# Patient Record
Sex: Male | Born: 1940 | Race: White | Hispanic: No | Marital: Married | Smoking: Former smoker
Health system: Southern US, Community
[De-identification: ages and names within clinical notes are randomized; demographics above are authoritative.]

## PROBLEM LIST (undated history)

## (undated) DIAGNOSIS — E538 Deficiency of other specified B group vitamins: Secondary | ICD-10-CM

## (undated) DIAGNOSIS — G20 Parkinson's disease: Secondary | ICD-10-CM

## (undated) DIAGNOSIS — C61 Malignant neoplasm of prostate: Secondary | ICD-10-CM

## (undated) DIAGNOSIS — E781 Pure hyperglyceridemia: Secondary | ICD-10-CM

## (undated) DIAGNOSIS — F411 Generalized anxiety disorder: Secondary | ICD-10-CM

## (undated) DIAGNOSIS — G20A1 Parkinson's disease without dyskinesia, without mention of fluctuations: Secondary | ICD-10-CM

## (undated) DIAGNOSIS — K5090 Crohn's disease, unspecified, without complications: Secondary | ICD-10-CM

## (undated) DIAGNOSIS — E611 Iron deficiency: Secondary | ICD-10-CM

## (undated) DIAGNOSIS — C189 Malignant neoplasm of colon, unspecified: Secondary | ICD-10-CM

## (undated) HISTORY — DX: Deficiency of other specified B group vitamins: E53.8

## (undated) HISTORY — DX: Malignant neoplasm of colon, unspecified: C18.9

## (undated) HISTORY — PX: CATARACT EXTRACTION, BILATERAL: SHX1313

## (undated) HISTORY — DX: Generalized anxiety disorder: F41.1

## (undated) HISTORY — PX: COLON RESECTION: SHX5231

## (undated) HISTORY — DX: Iron deficiency: E61.1

---

## 2018-08-29 DIAGNOSIS — K5010 Crohn's disease of large intestine without complications: Secondary | ICD-10-CM | POA: Insufficient documentation

## 2018-08-29 DIAGNOSIS — F419 Anxiety disorder, unspecified: Secondary | ICD-10-CM | POA: Insufficient documentation

## 2018-10-06 DIAGNOSIS — Z8546 Personal history of malignant neoplasm of prostate: Secondary | ICD-10-CM | POA: Insufficient documentation

## 2018-11-20 DIAGNOSIS — D510 Vitamin B12 deficiency anemia due to intrinsic factor deficiency: Secondary | ICD-10-CM | POA: Insufficient documentation

## 2018-11-20 DIAGNOSIS — F339 Major depressive disorder, recurrent, unspecified: Secondary | ICD-10-CM | POA: Insufficient documentation

## 2018-11-20 DIAGNOSIS — G629 Polyneuropathy, unspecified: Secondary | ICD-10-CM | POA: Insufficient documentation

## 2019-03-08 ENCOUNTER — Other Ambulatory Visit: Payer: Self-pay

## 2019-03-08 ENCOUNTER — Encounter (HOSPITAL_BASED_OUTPATIENT_CLINIC_OR_DEPARTMENT_OTHER): Payer: Self-pay | Admitting: Emergency Medicine

## 2019-03-08 ENCOUNTER — Emergency Department (HOSPITAL_BASED_OUTPATIENT_CLINIC_OR_DEPARTMENT_OTHER)
Admission: EM | Admit: 2019-03-08 | Discharge: 2019-03-08 | Disposition: A | Discharge status: Home / Self Care | Payer: Medicare HMO | Attending: Emergency Medicine | Admitting: Emergency Medicine

## 2019-03-08 ENCOUNTER — Emergency Department (HOSPITAL_BASED_OUTPATIENT_CLINIC_OR_DEPARTMENT_OTHER): Payer: Medicare HMO

## 2019-03-08 DIAGNOSIS — W19XXXA Unspecified fall, initial encounter: Secondary | ICD-10-CM

## 2019-03-08 DIAGNOSIS — W010XXA Fall on same level from slipping, tripping and stumbling without subsequent striking against object, initial encounter: Secondary | ICD-10-CM | POA: Diagnosis not present

## 2019-03-08 DIAGNOSIS — Y999 Unspecified external cause status: Secondary | ICD-10-CM | POA: Insufficient documentation

## 2019-03-08 DIAGNOSIS — Y9301 Activity, walking, marching and hiking: Secondary | ICD-10-CM | POA: Diagnosis not present

## 2019-03-08 DIAGNOSIS — S0181XA Laceration without foreign body of other part of head, initial encounter: Secondary | ICD-10-CM | POA: Insufficient documentation

## 2019-03-08 DIAGNOSIS — R51 Headache: Secondary | ICD-10-CM | POA: Diagnosis not present

## 2019-03-08 DIAGNOSIS — Y92002 Bathroom of unspecified non-institutional (private) residence single-family (private) house as the place of occurrence of the external cause: Secondary | ICD-10-CM | POA: Insufficient documentation

## 2019-03-08 DIAGNOSIS — S0990XA Unspecified injury of head, initial encounter: Secondary | ICD-10-CM

## 2019-03-08 HISTORY — DX: Parkinson's disease: G20

## 2019-03-08 HISTORY — DX: Malignant neoplasm of prostate: C61

## 2019-03-08 HISTORY — DX: Parkinson's disease without dyskinesia, without mention of fluctuations: G20.A1

## 2019-03-08 HISTORY — DX: Pure hyperglyceridemia: E78.1

## 2019-03-08 HISTORY — DX: Crohn's disease, unspecified, without complications: K50.90

## 2019-03-08 MED ORDER — LIDOCAINE HCL (PF) 1 % IJ SOLN
5.0000 mL | Freq: Once | INTRAMUSCULAR | Status: AC
  Administered 2019-03-08: 5 mL

## 2019-03-08 MED ORDER — LORAZEPAM 1 MG PO TABS
0.5000 mg | ORAL_TABLET | Freq: Once | ORAL | Status: AC
  Administered 2019-03-08: 09:00:00 0.5 mg via ORAL
  Filled 2019-03-08: qty 1

## 2019-03-08 NOTE — ED Notes (Signed)
ED Provider at bedside. 

## 2019-03-08 NOTE — ED Provider Notes (Signed)
Emergency Department Provider Note   I have reviewed the triage vital signs and the nursing notes.   HISTORY  Chief Complaint Fall   HPI Jared Schaefer is a 78 y.o. male with past medical history reviewed below presents to the emergency department for evaluation after mechanical fall at home.  Patient states he was walking to the bathroom early this morning and states he was "half asleep" when he tripped over something on the floor.  He fell forward striking his head but denies loss of consciousness.  He noticed a cut over the right eyebrow and some bleeding from the bridge of the nose.  He denies pain in the arms or legs.  No back, abdomen, chest pain.  No presyncope symptoms prior to falling.  Patient does not anticoagulated.    Past Medical History:  Diagnosis Date   Crohn's disease ([place])    High triglycerides    Parkinson's disease ([place])    Prostate cancer ([place])     There are no active problems to display for this patient.  Allergies Patient has no known allergies.  No family history on file.  Social History Social History   Tobacco Use   Smoking status: Never Smoker   Smokeless tobacco: Never Used  Substance Use Topics   Alcohol use: Not on file   Drug use: Not on file    Review of Systems  Constitutional: No fever/chills Eyes: No visual changes. ENT: No sore throat. Cardiovascular: Denies chest pain. Respiratory: Denies shortness of breath. Gastrointestinal: No abdominal pain.  No nausea, no vomiting.  No diarrhea.  No constipation. Genitourinary: Negative for dysuria. Musculoskeletal: Negative for back pain. Skin: Negative for rash. Right forehead laceration and bleeding from bridge of nose.  Neurological: Negative for headaches, focal weakness or numbness.  10-point ROS otherwise negative.  ____________________________________________   PHYSICAL EXAM:  VITAL SIGNS: ED Triage Vitals  Enc Vitals Group     BP 03/08/19 0805 (!) 171/86   Pulse Rate 03/08/19 0805 80     Resp 03/08/19 0805 18     Temp 03/08/19 0805 98.1 F (36.7 C)     Temp Source 03/08/19 0805 Oral     SpO2 03/08/19 0805 100 %     Weight 03/08/19 0806 200 lb (90.7 kg)     Height 03/08/19 0806 6' (1.829 m)   Constitutional: Alert and oriented. Well appearing and in no acute distress. Eyes: Conjunctivae are normal. PERRL.  Head: Atraumatic. Nose: No congestion/rhinnorhea. Mouth/Throat: Mucous membranes are moist.  Neck: No stridor. No cervical spine tenderness to palpation. Cardiovascular: Normal rate, regular rhythm.  Respiratory: Normal respiratory effort.  Gastrointestinal: No distention.  Musculoskeletal: No lower extremity tenderness nor edema. No gross deformities of extremities. Neurologic:  Normal speech and language. No gross focal neurologic deficits are appreciated.  Skin:  Skin is warm and dry. 4 cm laceration to the right forehead and superficial abrasion to the nasal bridge without laceration.    ____________________________________________  RADIOLOGY  Ct Head Wo Contrast  Result Date: 03/08/2019 CLINICAL DATA:  Pain following fall EXAM: CT HEAD WITHOUT CONTRAST TECHNIQUE: Contiguous axial images were obtained from the base of the skull through the vertex without intravenous contrast. COMPARISON:  None. FINDINGS: Brain: There is mild diffuse atrophy. There is no intracranial mass, hemorrhage, extra-axial fluid collection, or midline shift. There is mild small vessel disease in the centra semiovale bilaterally. No acute infarct is demonstrable on this study. Vascular: There is no hyperdense vessel. There are foci of calcification  in each carotid siphon region. Skull: Bony calvarium appears intact. There is a right frontal scalp hematoma. Sinuses/Orbits: There is mucosal thickening in several ethmoid air cells. Other visualized paranasal sinuses are clear. There is mild soft tissue swelling slightly superior to the right orbit. Orbits otherwise  appear symmetric bilaterally. Other: Visualized mastoid air cells are clear. IMPRESSION: 1. 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 No acute infarct. No mass or hemorrhage. 2.  There are foci of arterial vascular calcification. 3. Right frontal scalp hematoma and soft tissue swelling superior to the right orbit. No bony abnormality. No intraorbital lesion evident. 4.  Mucosal thickening in several ethmoid air cells. Electronically Signed   By: Lowella Grip III M.D.   On: 03/08/2019 08:30    ____________________________________________   PROCEDURES  Procedure(s) performed:   Marland KitchenMarland KitchenLaceration Repair  Date/Time: 03/08/2019 9:09 AM Performed by: Margette Fast, MD Authorized by: Margette Fast, MD   Consent:    Consent obtained:  Verbal   Consent given by:  Patient   Risks discussed:  Infection, need for additional repair, nerve damage, pain, poor cosmetic result, poor wound healing, retained foreign body and vascular damage   Alternatives discussed:  No treatment Anesthesia (see MAR for exact dosages):    Anesthesia method:  Local infiltration   Local anesthetic:  Lidocaine 2% w/o epi Laceration details:    Location:  Face   Face location:  Forehead   Length (cm):  4 Repair type:    Repair type:  Simple Pre-procedure details:    Preparation:  Patient was prepped and draped in usual sterile fashion and imaging obtained to evaluate for foreign bodies Exploration:    Hemostasis achieved with:  Direct pressure   Wound exploration: wound explored through full range of motion and entire depth of wound probed and visualized     Wound extent: no foreign bodies/material noted, no muscle damage noted, no nerve damage noted, no underlying fracture noted and no vascular damage noted     Contaminated: no   Treatment:    Area cleansed with:  Betadine   Amount of cleaning:  Standard Skin repair:    Repair method:  Sutures   Suture size:  5-0   Suture material:  Prolene    Suture technique:  Simple interrupted   Number of sutures:  6 Approximation:    Approximation:  Close Post-procedure details:    Dressing:  Open (no dressing)   Patient tolerance of procedure:  Tolerated well, no immediate complications    ____________________________________________   INITIAL IMPRESSION / ASSESSMENT AND PLAN / ED COURSE  Pertinent labs & imaging results that were available during my care of the patient were reviewed by me and considered in my medical decision making (see chart for details).   Patient presents to the emergency department for evaluation of mechanical fall at home.  He has an approximately 3 cm right frontal scalp laceration which will require suture.  CT imaging reviewed which shows no acute traumatic process.  Plan for laceration repair.   Laceration repaired as above.  Wound is hemostatic.  Discussed return in 7 days for suture removal either here in the emergency department or with her PCP.  Discussed ED return precautions.  ____________________________________________  FINAL CLINICAL IMPRESSION(S) / ED DIAGNOSES  Final diagnoses:  Fall, initial encounter  Injury of head, initial encounter  Laceration of forehead, initial encounter     MEDICATIONS GIVEN DURING THIS VISIT:  Medications  lidocaine (PF) (XYLOCAINE) 1 % injection 5  mL (5 mLs Infiltration Given 03/08/19 0817)  LORazepam (ATIVAN) tablet 0.5 mg (0.5 mg Oral Given 03/08/19 0849)    Note:  This document was prepared using Dragon voice recognition software and may include unintentional dictation errors.  Nanda Quinton, MD, [facility]    Vantasia Pinkney, [facility], MD 03/08/19 [id number]

## 2019-03-08 NOTE — ED Triage Notes (Signed)
Pt fell while walking from the bathroom.  Pt tripped on something, denies dizziness prior to fall.  Denies loc.  Pt has laceration over right eyebrow, one smaller one on bridge of nose.  Small abrasion to right knee.  Denies any generalized pain currently.

## 2019-03-08 NOTE — Discharge Instructions (Signed)
You were seen in the emergency department today after head injury.  This was repaired with suture.  This will need to be removed in 7 days.  Your primary care doctor can remove these or you can return to the emergency department, preferably early in the morning, to have the sutures removed in 7 days.  If you develop sudden worsening headache, weakness, confusion, vomiting he should return to the emergency department.  If the wound becomes red, inflamed, begins to drain this could be sign of infection and you should also be reevaluated.

## 2019-05-31 ENCOUNTER — Encounter: Payer: Self-pay | Admitting: Neurology

## 2019-06-25 NOTE — Progress Notes (Signed)
Jared Schaefer was seen today in the movement disorders clinic for neurologic consultation at the request of Billie Ruddy I, NP.  The consultation is for the evaluation of PD.  Outside records that were made available to me were reviewed, including those from Dr. Linus Mako, whom the patient just saw as a NP on 03/24/19.  Prior to that, the patient was taken care of at [facility].  Patient was diagnosed with Parkinson's disease in 2011.  First medication from was levodopa.  First symptom was dyscoordination.  no tremor ever.  Records indicate that the patient has done very well since [age], with complications of the disease including dyskinesia.  Patient is on no medication for that.  When he saw Dr. Linus Mako, the plan was to try to decrease the anxiety and also to recognize whether or not this was an off state.  He felt that perhaps attempting low-dose pramipexole would help him with the off state, but he did not give him a prescription for that.    Current movement disorder medications: Carbidopa/levodopa 25/100, 2 tablets 4 times per day (8am/12noon/4pm/8pm) Carbidopa/levodopa 50/200 at bed (10-11pm) Entacapone 1 tablet 3 times per day (first 3 dosages of levodopa - been on for 2-3 years) Mirtazapine, 15 mg q hs prozac 40 mg daily b12 Ativan tid  Specific Symptoms:  Tremor: No. Family hx of similar:  No. Voice: softer Sleep: sleeps well (on meds to help sleep)  Vivid Dreams:  No.  Acting out dreams:  Yes.  , just laughs out Wet Pillows: Yes.  , occasionally Postural symptoms:  Yes.    Falls?  No. Bradykinesia symptoms: shuffling gait but he thinks that ativan helps that Loss of smell:  No. but has never had good sense of smell Loss of taste:  No. Urinary Incontinence:  No. 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 Trouble with ADL's:  No.  Trouble buttoning clothing: No. Depression:  No. but does report anxiety.  Takes Ativan 0.5 mg, up to 3  times per day. Memory changes:  Yes.   with names/word retrieval Hallucinations:  No.  visual distortions: Yes.   N/V:  No. Lightheaded:  No.  Syncope: No. Diplopia:  No. Dyskinesia:  Yes.  , occasionally, but not bothersome to patient   ALLERGIES:  No Known Allergies  CURRENT MEDICATIONS:  Current Outpatient Medications  Medication Instructions  . carbidopa-levodopa (SINEMET CR) 50-200 MG tablet 1 tablet, Oral, Daily at bedtime  . carbidopa-levodopa (SINEMET IR) 25-100 MG tablet 2 tablets, Oral, 4 times daily  . Cholecalciferol (VITAMIN D3 SUPER STRENGTH) 50 MCG (2000 UT) TABS Oral  . cyanocobalamin (,VITAMIN B-12,) 1000 MCG/ML injection Intramuscular  . entacapone (COMTAN) 200 MG tablet 1 tablet, Oral, 3 times daily  . fenofibrate (TRICOR) 145 mg, Oral, Daily  . Ferrous Sulfate (IRON PO) Oral, Slow release  . FLUoxetine (PROZAC) 40 mg, Oral, Daily  . folic acid (FOLVITE) 2 mg, Oral, Daily  . LORazepam (ATIVAN) 0.5 MG tablet 1 tablet, Oral, 2 times daily PRN  . Melatonin 3 MG CAPS 1 capsule, Oral, At bedtime PRN  . mirtazapine (REMERON) 15 MG tablet 1 tablet, Oral, Daily at bedtime  . OPIUM TINCTURE, PAREGORIC, PO 2 drops, Oral, As needed  . sulfaSALAzine (AZULFIDINE) 3,000 mg, Oral, Daily    VITALS:   Vitals:   06/28/19 1005  BP: 120/68  Pulse: 85  Resp: 16  SpO2: 95%  Weight: 191 lb 3.2 oz (86.7 kg)  Height:  6' 1"  (1.854 m)    GEN:  The patient appears stated age and is in NAD. HEENT:  Normocephalic, atraumatic.  The mucous membranes are moist. The superficial temporal arteries are without ropiness or tenderness. CV:  RRR Lungs:  CTAB Neck/HEME:  There are no carotid bruits bilaterally.  Neurological examination:  Orientation: The patient is alert and oriented x3.  Cranial nerves: There is good facial symmetry. Extraocular muscles are intact. The visual fields are full to confrontational testing. The speech is fluent and clear. Soft palate rises symmetrically  and there is no tongue deviation. Hearing is intact to conversational tone. Sensation: Sensation is intact to light and pinprick throughout (facial, trunk, extremities). Vibration is intact at the bilateral big toe. There is no extinction with double simultaneous stimulation. There is no sensory dermatomal level identified. Motor: Strength is 5/5 in the bilateral upper and lower extremities.   Shoulder shrug is equal and symmetric.  There is no pronator drift. Deep tendon reflexes: Deep tendon reflexes are 2/4 at the bilateral biceps, triceps, brachioradialis, patella and achilles. Plantar responses are downgoing bilaterally.  Movement examination: Tone: There is very mild increased tone in the RUE/RLE Abnormal movements: mild dyskinesia in the head/axial region only when significantly distracted Coordination:  There is minimal decremation with RAM's, with any form of RAMS, including alternating supination and pronation of the forearm, hand opening and closing on the right Gait and Station: The patient has no difficulty arising out of a deep-seated chair without the use of the hands. The patient's stride length is good with mild decreased arm swing on the L.      Labs: I have reviewed and interpreted patient's lab work.  He had lab work on May 19, 2019.  White blood cells were slightly low at 4.3, hemoglobin was 11.7 (this was up from 8.3 in November), hematocrit 35.7 and platelets 152.  Patient's iron was normal at 150, but his ferritin is low at 26.5.  On March 10, 2019, his sodium was 137, potassium 4.2, chloride 107, CO2 25, BUN 17, creatinine 1.32, glucose 100, AST 19, ALT 5.   ASSESSMENT/PLAN:  1.  Parkinson's disease, diagnosed 2011, akinetic rigid type,   -movements adequately controlled on meds today  -Patient has been taking care of at [facility].  He sought consultation with Dr. Linus Mako in October, 2020.    -Patient's disease complicated by nonbothersome dyskinesia.  -Patient will  continue on carbidopa/levodopa 25/100, 2 tablets 4 times per day.  -continue carbidopa/levodopa 50/200 q hs  -Patient will continue on entacapone, 200 mg, 1 tablet 3 times per day  -Discussed importance of safe, cardiovascular exercise  -Met my Education officer, museum as part of multidisciplinary program.  -We discussed that it used to be thought that levodopa would increase risk of melanoma but now it is believed that Parkinsons itself likely increases risk of melanoma. he is to get regular skin checks.  2.  b12 deficiency  -gets monthly injections  3.  Anemia  -getting iron supplements  4.  GAD  -think that the pandemic has worsened this and he had a major move from [facility] to [place] during this and hasn't establish his network of people because of covid.  This his sense of fatigue, while it could be from meds, may be from depression and not being able to participate in things that he could.  He plans to go back to gym at [place] soon.  Had him meet my PD LCSW and encouraged him to get involved  with our programs, even if online.   Total time spent on today's visit was  60 minutes, including both face-to-face time and nonface-to-face time.  Time included that spent on review of records (prior notes available to me/labs/imaging if pertinent), discussing treatment and goals, answering patient's questions and coordinating care.  Cc:  [place], [place]

## 2019-06-28 ENCOUNTER — Encounter: Payer: Self-pay | Admitting: Neurology

## 2019-06-28 ENCOUNTER — Other Ambulatory Visit: Payer: Self-pay

## 2019-06-28 ENCOUNTER — Ambulatory Visit (INDEPENDENT_AMBULATORY_CARE_PROVIDER_SITE_OTHER): Payer: Medicare HMO | Admitting: Clinical

## 2019-06-28 ENCOUNTER — Ambulatory Visit: Payer: Medicare HMO | Admitting: Neurology

## 2019-06-28 VITALS — BP 120/68 | HR 85 | Resp 16 | Ht 73.0 in | Wt 191.2 lb

## 2019-06-28 DIAGNOSIS — D509 Iron deficiency anemia, unspecified: Secondary | ICD-10-CM | POA: Diagnosis not present

## 2019-06-28 DIAGNOSIS — G249 Dystonia, unspecified: Secondary | ICD-10-CM

## 2019-06-28 DIAGNOSIS — E538 Deficiency of other specified B group vitamins: Secondary | ICD-10-CM | POA: Diagnosis not present

## 2019-06-28 DIAGNOSIS — G20 Parkinson's disease: Secondary | ICD-10-CM

## 2019-06-28 DIAGNOSIS — F411 Generalized anxiety disorder: Secondary | ICD-10-CM

## 2019-06-28 DIAGNOSIS — Z719 Counseling, unspecified: Secondary | ICD-10-CM

## 2019-06-28 NOTE — Patient Instructions (Signed)
You need to get back to the gym safely!  I would love to see you in a support group.  The physicians and staff at [facility] are committed to providing excellent care. You may receive a survey requesting feedback about your experience at our office. We strive to receive "very good" responses to the survey questions. If you feel that your experience would prevent you from giving the office a "very good " response, please contact our office to try to remedy the situation. We may be reached at [phone]. Thank you for taking the time out of your busy day to complete the survey.

## 2019-06-29 NOTE — BH Specialist Note (Signed)
Referring Provider: Alonza Bogus, DO Date of Referral: 06/28/2019 Primary Reason for Referral: New pt-hx Parkinson's Location of Visit: Individual, office visit  Suicide/Homicide Risk: Pt denies risk Subjective Notes:  Psychosocial Assessment Patient presents today for psychoeducation with LCSW following with new patient appt for Parkinson's Disease by referring provider Dr. Wells Guiles Tat. LCSW provided patient education on nonmotor Parkinson's symptoms such as apathy, depression, incontinence/constipation, sleep behavior disorders, communication and cognitive impairment. LCSW provided supportive counseling as pt reports hx of anxiety and current low-mood likely exacerbated by social isolation of Covid pandemic and recent relocation to [place] in Spring 2020.  LCSW provided pt with information about our support and educational groups for patients with Parkinson's as well as their care partners, also discussed the importance of forced intense exercise in the management of PD and provided information about exercise opportunities.  Also discussed availability of individual counseling sessions to address the adjustment of living with chronic disease of Parkinson's, invited patient to schedule with LCSW as desired. Pt responded receptively to patient education today.   Brief Interventions provided today in session 1. psychoeducation, patient education 2. Supportive counseling   Plan 1. Read "Newly Diagnosed"/ FAQs ([place]), Information provided to pt. Contact LCSW with any questions related to Parkinson's & behavioral health  2. Goal for exercise and connection to Parkinson's online groups  Behavioral Health treatment recommendations communicated to referring provider and pt states agreement with plan. LCSW will remain available for future consultation.

## 2019-07-16 DIAGNOSIS — G629 Polyneuropathy, unspecified: Secondary | ICD-10-CM | POA: Insufficient documentation

## 2019-07-16 DIAGNOSIS — R202 Paresthesia of skin: Secondary | ICD-10-CM | POA: Insufficient documentation

## 2019-08-25 ENCOUNTER — Telehealth: Payer: Self-pay | Admitting: Neurology

## 2019-08-25 NOTE — Telephone Encounter (Signed)
Advised patients spouse that I would forward the message to Dr Tat and give her a call tomorrow. She voiced understanding.

## 2019-08-25 NOTE — Telephone Encounter (Signed)
Patient's wife called regarding Jared Schaefer feeling depressed and anxiety. She is wondering should his other medications be increased or his Parkinson's medication be adjusted? Please Call. Thank you

## 2019-08-26 NOTE — Telephone Encounter (Signed)
Patients wife notified and voiced understanding.

## 2019-08-26 NOTE — Telephone Encounter (Signed)
They should contact their PCP regarding treatment for anxiety and depression.  Its an important aspect of his care but one that should be done in concert with his PCP and/or psychiatrist.

## 2019-11-02 ENCOUNTER — Telehealth: Payer: Self-pay | Admitting: Neurology

## 2019-11-02 NOTE — Telephone Encounter (Signed)
Patient would like to speak to someone about a new medication he thinks it is spelled Nuplax please call

## 2019-11-02 NOTE — Telephone Encounter (Signed)
Spoke with patient and he states he saw an ad on the TV that said 60-70 % of people who have parkinson's disease have hallucinations. He wants to know if he can get a sooner appt to discuss Nuplazid. He was not happy about being seen every 6 months. He states when he was in [place]. his doctor had a PA who he could see if the doctor was unavailable. I explained to him that we do not have that here. Informed patient that I would speak with Dr Tat.

## 2019-11-02 NOTE — Telephone Encounter (Signed)
He knew when he came to our practice that we don't have NP/PA at this time.  After he left [place], he went to [facility] and saw Dr. Linus Mako and an NP and then transferred here.  He could certainly go back if he prefers that care model Re: nuplazid.  First, the percent is not correct, its closer to 40%.  Second, as far as I know he doesn't have hallucinations.  IF he developed them since our last visit, he needs to talk to his new psychiatrist as they have recently changed his meds per records: - DECREASE Fluoxetine from 40 to 23m qd, with ultimate goal of d/c since this hasn't seemed to be helpful, to reduce polypharmacy - INCREASE Mirtazapine from 15 to 321mqhs for sleep, anxiety - RESTART Bupropion XL 1506md for mood, energy - Continue Lorazepam 0.5mg37mD PRN. Encouraged PRN use only. 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

## 2019-11-03 NOTE — Telephone Encounter (Signed)
Patient has been notified directly; all questions, if any, were answered. Patient voiced understanding.   Gave follow up appointment date and time.

## 2019-11-24 NOTE — Progress Notes (Deleted)
Assessment/Plan:   1.  Parkinsons Disease, diagnosed 2011  -***Continue carbidopa/levodopa 25/100, 2 tablets 4 times per day  -Continue carbidopa/levodopa 50/200 nightly  -Continue entacapone 200 mg, 1 tablet 3 times per day  2.  B12 deficiency  -on injections  3.  Iron def anemia  -on supplement  4.  GAD  -some related to pandemic.  Some related to move from [facility] and not having network of people  -Patient now following with psychiatry.  On fairly high-dose mirtazapine, 45 mg at bedtime as well as lorazepam 0.5 mg 4 times per day.  Discussed with patient that I really do not care for this dose of lorazepam in my aging, Parkinson's population.   Subjective:   Jared Schaefer was seen today in follow up for Parkinsons disease.  My previous records were reviewed prior to todays visit as well as outside records available to me. Pt denies falls.  Pt denies lightheadedness, near syncope.  No hallucinations.  Patient has followed with psychiatry.  He last saw psychiatry on May 28.  His mirtazapine and lorazepam were increased.  He was  Current prescribed movement disorder medications: ***Carbidopa/levodopa 25/100, 2 po qid Carbidopa/levodopa 50/200 q hs Entacapone, 200 tid B12 inj Mirtazapine, 45 mg at bedtime (prescribed by psychiatry) Lorazepam 0.5 mg 4 times per day (prescribed by psychiatry)  PREVIOUS MEDICATIONS: {Parkinson's RX:18200}  ALLERGIES:  No Known Allergies  CURRENT MEDICATIONS:  Outpatient Encounter Medications as of 11/29/2019  Medication Sig  . carbidopa-levodopa (SINEMET CR) 50-200 MG tablet Take 1 tablet by mouth at bedtime.  . carbidopa-levodopa (SINEMET IR) 25-100 MG tablet Take 2 tablets by mouth 4 (four) times daily.   . Cholecalciferol (VITAMIN D3 SUPER STRENGTH) 50 MCG (2000 UT) TABS Take by mouth.  . cyanocobalamin (,VITAMIN B-12,) 1000 MCG/ML injection Inject into the muscle.  . entacapone (COMTAN) 200 MG tablet Take 1 tablet by mouth 3 (three) times  daily.  . fenofibrate (TRICOR) 145 MG tablet Take 145 mg by mouth daily.  . Ferrous Sulfate (IRON PO) Take by mouth. Slow release  . FLUoxetine (PROZAC) 40 MG capsule Take 40 mg by mouth daily.  . folic acid (FOLVITE) 1 MG tablet Take 2 mg by mouth daily.  Marland Kitchen LORazepam (ATIVAN) 0.5 MG tablet Take 1 tablet by mouth 2 (two) times daily as needed.  . Melatonin 3 MG CAPS Take 1 capsule by mouth at bedtime as needed.  . mirtazapine (REMERON) 15 MG tablet Take 1 tablet by mouth at bedtime.  . OPIUM TINCTURE, PAREGORIC, PO Take 2 drops by mouth as needed.  . sulfaSALAzine (AZULFIDINE) 500 MG tablet Take 3,000 mg by mouth daily.   No facility-administered encounter medications on file as of 11/29/2019.    Objective:   PHYSICAL EXAMINATION:    VITALS:  There were no vitals filed for this visit.  GEN:  The patient appears stated age and is in NAD. HEENT:  Normocephalic, atraumatic.  The mucous membranes are moist. The superficial temporal arteries are without ropiness or tenderness. CV:  RRR Lungs:  CTAB Neck/HEME:  There are no carotid bruits bilaterally.  Neurological examination:  Orientation: The patient is alert and oriented x3. Cranial nerves: There is good facial symmetry with*** facial hypomimia. The speech is fluent and clear. Soft palate rises symmetrically and there is no tongue deviation. Hearing is intact to conversational tone. Sensation: Sensation is intact to light touch throughout Motor: Strength is at least antigravity x4.  Movement examination: Tone: There is ***tone in the ***  Abnormal movements: *** Coordination:  There is *** decremation with RAM's, *** Gait and Station: The patient has *** difficulty arising out of a deep-seated chair without the use of the hands. The patient's stride length is ***.  The patient has a *** pull test.     I have reviewed and interpreted the following labs independently    Chemistry   No results found for: NA, K, CL, CO2, BUN,  CREATININE, GLU No results found for: CALCIUM, ALKPHOS, AST, ALT, BILITOT     No results found for: WBC, HGB, HCT, MCV, PLT  No results found for: TSH   Total time spent on today's visit was ***30 minutes, including both face-to-face time and nonface-to-face time.  Time included that spent on review of records (prior notes available to me/labs/imaging if pertinent), discussing treatment and goals, answering patient's questions and coordinating care.  Cc:  [place], [place]

## 2019-11-29 ENCOUNTER — Other Ambulatory Visit: Payer: Self-pay

## 2019-11-29 ENCOUNTER — Ambulatory Visit: Payer: Medicare HMO | Admitting: Neurology

## 2019-12-07 DIAGNOSIS — G4730 Sleep apnea, unspecified: Secondary | ICD-10-CM | POA: Insufficient documentation

## 2019-12-15 NOTE — Progress Notes (Signed)
Assessment/Plan:   1.  Parkinsons Disease, diagnosed 2011  -Continue carbidopa/levodopa 25/100, 2 tablets 4 times per day  -Continue carbidopa/levodopa 50/200 nightly  -Continue entacapone 200 mg, 1 tablet 3 times per day.  Could consider holding to see if dry mouth gets better.  We discussed today and decided together to continue  -[facility]  -info given on RSB  2.  B12 deficiency  -on injections  3.  Iron def anemia  -on supplement  4.  GAD  -some related to pandemic.  Some related to move from [facility] and not having network of people  -Patient now following with psychiatry.  On fairly high-dose mirtazapine, 45 mg at bedtime as well as lorazepam 0.5 mg 4 times per day.  Discussed with patient that I really do not care for this dose of lorazepam in my aging, Parkinson's population.  PDMP reviewed.  Picked up 360 tablets of lorazepam on 11/24/19.  Pt states that he has just decreased to bid dosing and encouraged him to limit as both mirtazipine and lorazepam will cause EDS that he c/o.  5.  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  We talked about treatments.  The patient is not a candidate for oral anticholinergic therapy because of increased risk of confusion and falls.  We discussed Botox (type A and B) and 1% atropine drops.  We discusssed that candy like lemon drops can help by stimulating mm of the oropharynx to induce swallowing.  He declines botox.  Will let me know if changes mind  6.  Dry mouth  -try OTC biotene  Subjective:   Jared Schaefer was seen today in follow up for Parkinsons disease.  My previous records were reviewed prior to todays visit as well as outside records available to me. This patient is accompanied in the office by his spouse who supplements the history.Pt denies falls.  States that he exercises with DVD but not as faithful with it as should. Pt denies lightheadedness, near syncope.  No hallucinations.  Patient has followed  with psychiatry.  He last saw psychiatry on May 28.  His mirtazapine and lorazepam were increased.  He was .  He c/o EDS.  When we discussed that this could be from meds he said that he reduced ativan back to bid dosing.  He c/o dry mouth and sialorrhea both.    Current prescribed movement disorder medications: Carbidopa/levodopa 25/100, 2 po qid Carbidopa/levodopa 50/200 q hs Entacapone, 200 tid B12 inj Mirtazapine, 45 mg at bedtime (prescribed by psychiatry) Lorazepam 0.5 mg 4 times per day (prescribed by psychiatry)   ALLERGIES:  No Known Allergies  CURRENT MEDICATIONS:  Outpatient Encounter Medications as of 12/20/2019  Medication Sig  . carbidopa-levodopa (SINEMET CR) 50-200 MG tablet Take 1 tablet by mouth at bedtime.  . carbidopa-levodopa (SINEMET IR) 25-100 MG tablet Take 2 tablets by mouth 4 (four) times daily.   . Cholecalciferol (VITAMIN D3 SUPER STRENGTH) 50 MCG (2000 UT) TABS Take 1 tablet by mouth daily.   . cyanocobalamin (,VITAMIN B-12,) 1000 MCG/ML injection Inject 1,000 mcg into the muscle every 30 (thirty) days.   . entacapone (COMTAN) 200 MG tablet Take 1 tablet by mouth 3 (three) times daily.  . fenofibrate (TRICOR) 145 MG tablet Take 145 mg by mouth daily.  . Ferrous Sulfate (IRON PO) Take 1 tablet by mouth daily. Slow release  . folic acid (FOLVITE) 1 MG tablet Take 2 mg by mouth daily.  Marland Kitchen  LORazepam (ATIVAN) 0.5 MG tablet Take 1 tablet by mouth 2 (two) times daily as needed.  . Melatonin 3 MG CAPS Take 1 capsule by mouth at bedtime as needed.  . mirtazapine (REMERON) 15 MG tablet Take 1 tablet by mouth at bedtime.  . Multiple Vitamin (MULTIVITAMIN WITH MINERALS) TABS tablet Take 1 tablet by mouth daily.  . OPIUM TINCTURE, PAREGORIC, PO Take 2 drops by mouth as needed.  . sulfaSALAzine (AZULFIDINE) 500 MG tablet Take 3,000 mg by mouth daily.  . [DISCONTINUED] FLUoxetine (PROZAC) 40 MG capsule Take 40 mg by mouth daily. (Patient not taking: Reported on 12/20/2019)    No facility-administered encounter medications on file as of 12/20/2019.    Objective:   PHYSICAL EXAMINATION:    VITALS:   Vitals:   12/20/19 0836  BP: 131/80  Pulse: 86  SpO2: 97%  Weight: 195 lb (88.5 kg)  Height: 6' (1.829 m)    GEN:  The patient appears stated age and is in NAD. HEENT:  Normocephalic, atraumatic.  The mucous membranes are moist. The superficial temporal arteries are without ropiness or tenderness. CV:  RRR Lungs:  CTAB Neck/HEME:  There are no carotid bruits bilaterally.  Neurological examination:  Orientation: The patient is alert and oriented x3. Cranial nerves: There is good facial symmetry with facial hypomimia. The speech is fluent and clear and mildly hypophonic. Soft palate rises symmetrically and there is no tongue deviation. Hearing is decreased slightly to conversational tone. Sensation: Sensation is intact to light touch throughout Motor: Strength is at least antigravity x4.   Movement examination: Tone: There is normal tone in the UE/LE Abnormal movements: no Coordination:  There is min decremation with RAM's, with foot taps bilaterally Gait and Station: The patient has min difficulty arising out of a deep-seated chair without the use of the hands. The patient's stride length is decreased.  He is forward flexed at the waist and turns en bloc.  He has decreased arm swing.      Total time spent on today's visit was 30 minutes, including both face-to-face time and nonface-to-face time.  Time included that spent on review of records (prior notes available to me/labs/imaging if pertinent), discussing treatment and goals, answering patient's questions and coordinating care.  Cc:  [place], [place]

## 2019-12-20 ENCOUNTER — Ambulatory Visit (INDEPENDENT_AMBULATORY_CARE_PROVIDER_SITE_OTHER): Payer: Medicare HMO | Admitting: Neurology

## 2019-12-20 ENCOUNTER — Other Ambulatory Visit: Payer: Self-pay

## 2019-12-20 ENCOUNTER — Encounter: Payer: Self-pay | Admitting: Neurology

## 2019-12-20 VITALS — BP 131/80 | HR 86 | Ht 72.0 in | Wt 195.0 lb

## 2019-12-20 DIAGNOSIS — G4710 Hypersomnia, unspecified: Secondary | ICD-10-CM

## 2019-12-20 DIAGNOSIS — G20 Parkinson's disease: Secondary | ICD-10-CM

## 2019-12-20 DIAGNOSIS — F411 Generalized anxiety disorder: Secondary | ICD-10-CM | POA: Diagnosis not present

## 2019-12-20 NOTE — Patient Instructions (Addendum)
1.  Try OTC biotene for dry mouth 2.  Limit ativan as it can cause sleepiness and falls 3.  Let me know if you want to consider botox for drooling 4.  I will send a referral to [facility] for physical and speech therapy    The physicians and staff at [facility] are committed to providing excellent care. You may receive a survey requesting feedback about your experience at our office. We strive to receive "very good" responses to the survey questions. If you feel that your experience would prevent you from giving the office a "very good " response, please contact our office to try to remedy the situation. We may be reached at [phone]. Thank you for taking the time out of your busy day to complete the survey.

## 2019-12-20 NOTE — Addendum Note (Signed)
Addended by: Ulice Brilliant T on: 12/20/2019 10:54 AM   Modules accepted: Orders

## 2020-01-31 IMAGING — CT CT HEAD W/O CM
3 series · 15 of 47 positions shown, 18 images · non-contrast
Comparison: None.

CLINICAL DATA: Pain following fall

EXAM:
CT HEAD WITHOUT CONTRAST
TECHNIQUE: Contiguous axial images were obtained from the base of the skull
through the vertex without intravenous contrast.

[Series 2: head wo · axial · 0.49mm/px · z∈[-95,+45]mm · 9 of 34 slices shown, 12 images]
[im 3/34  brain]
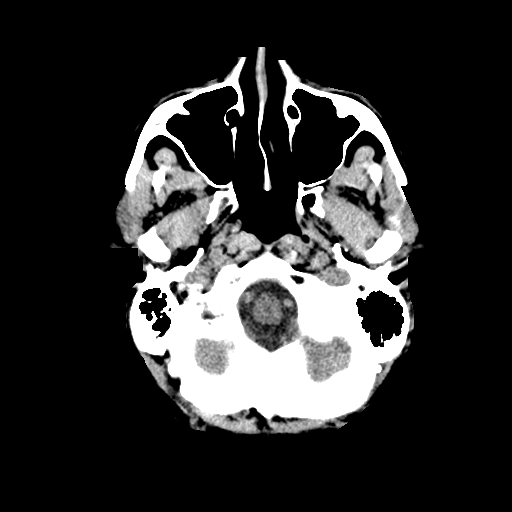
[im 3/34  bone]
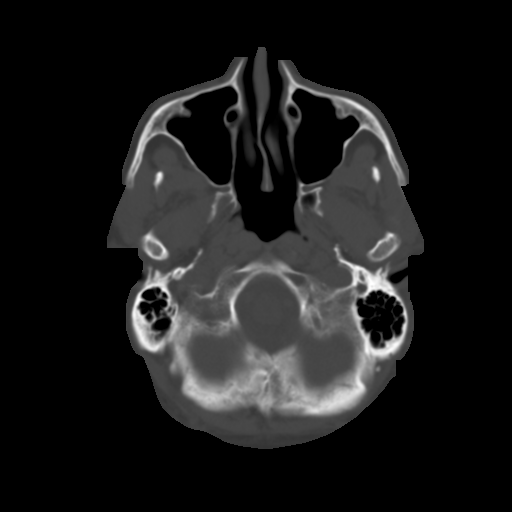
[im 6/34  brain]
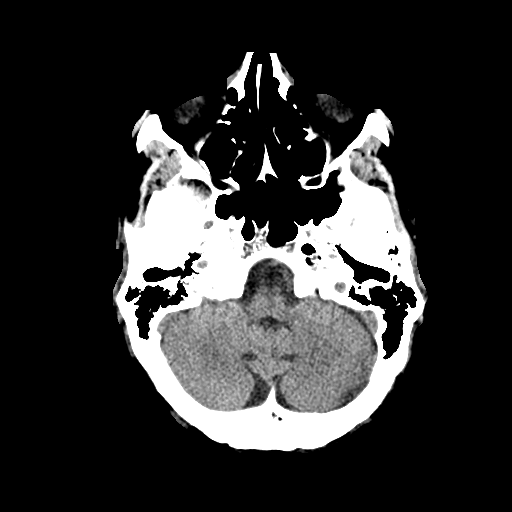
[im 10/34  brain]
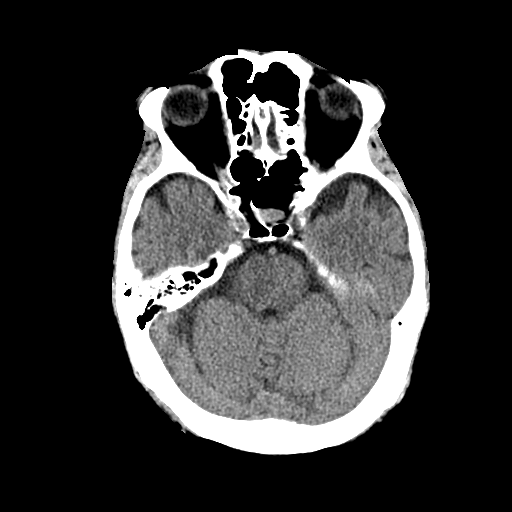
[im 13/34  brain]
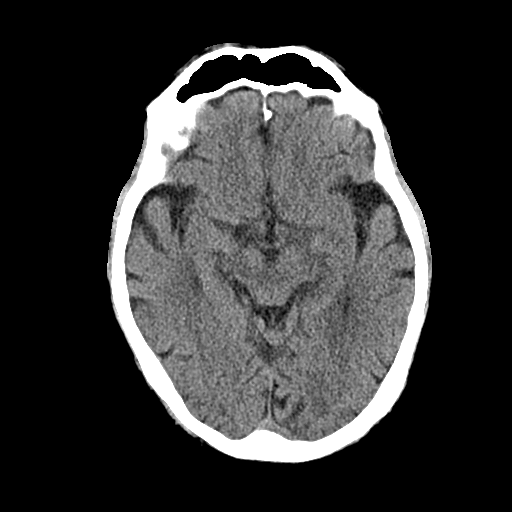
[im 18/34  brain]
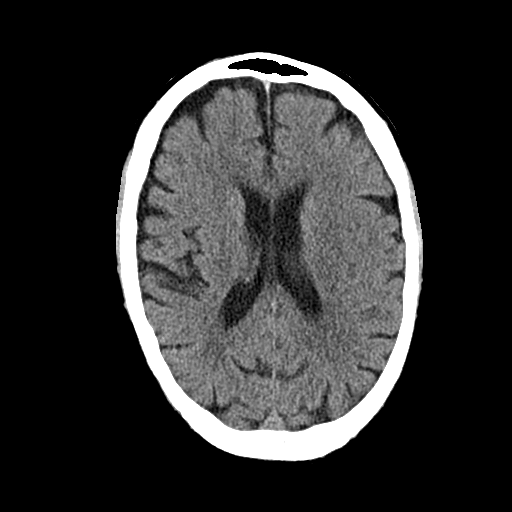
[im 18/34  bone]
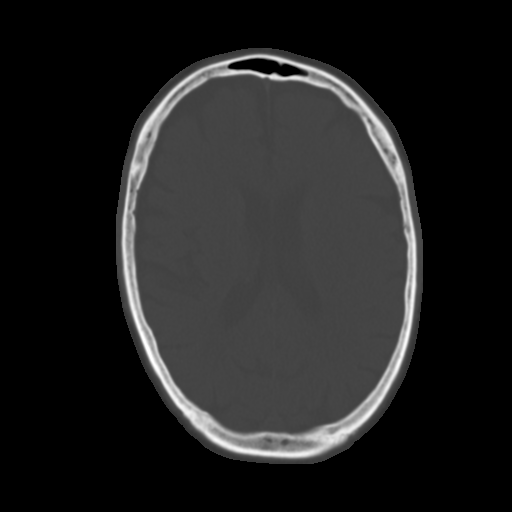
[im 21/34  brain]
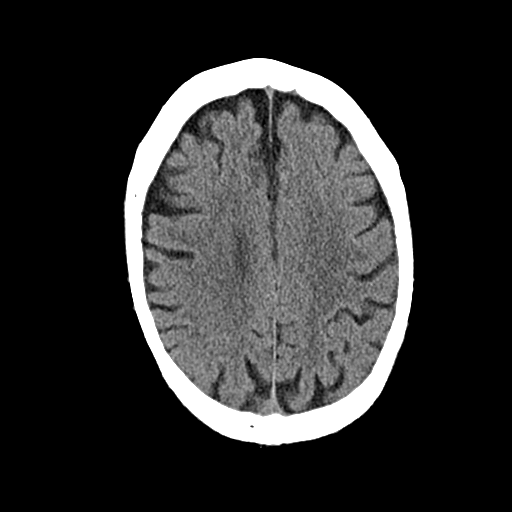
[im 24/34  brain]
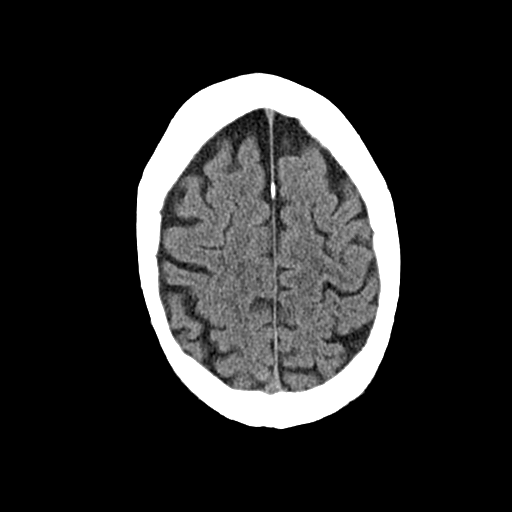
[im 28/34  brain]
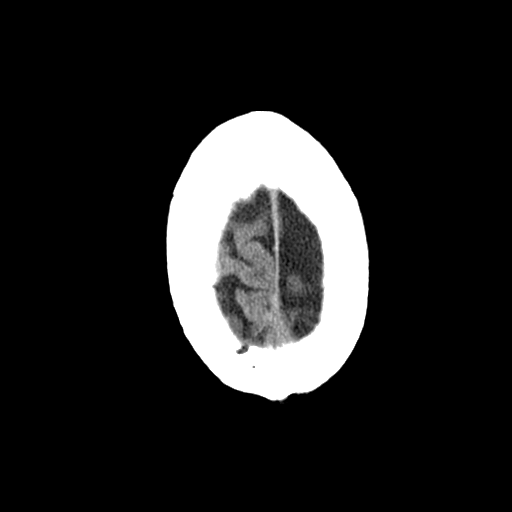
[im 31/34  brain]
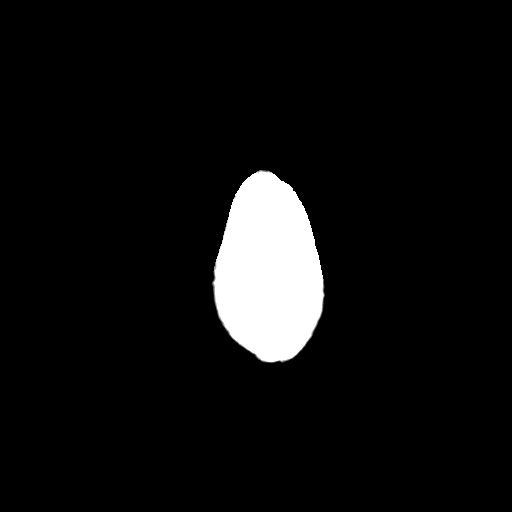
[im 31/34  bone]
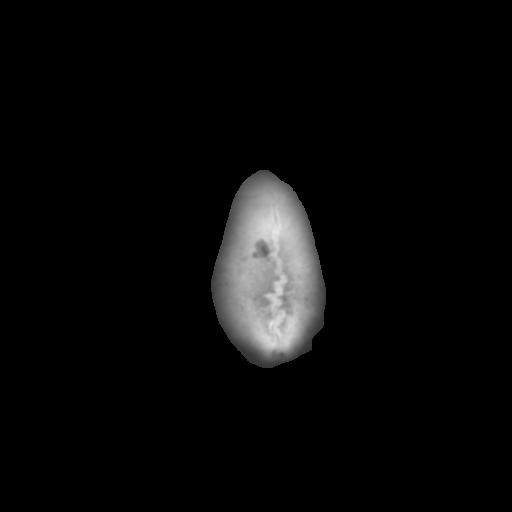

[Series 4: cor soft · coronal · 0.34mm/px · 3 of 76 slices shown]
[im 26/76  brain]
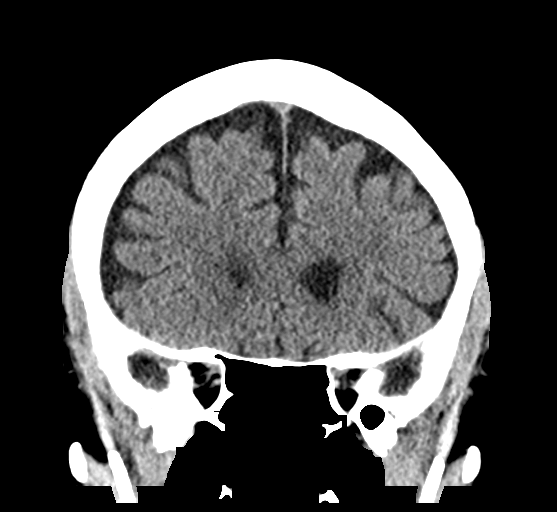
[im 34/76  brain]
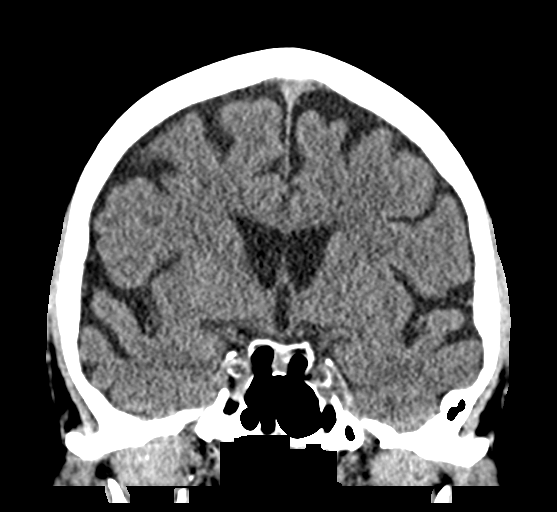
[im 42/76  brain]
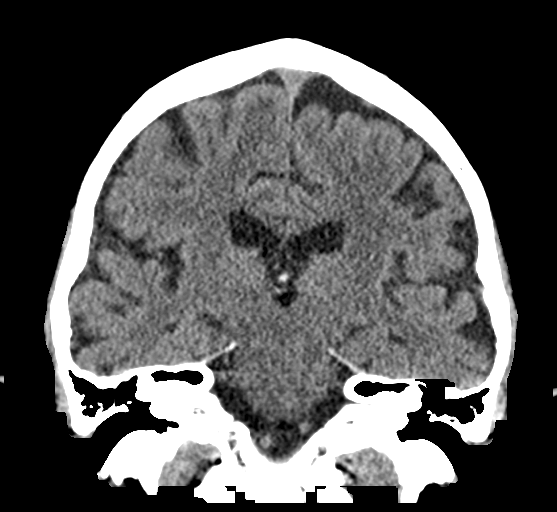

[Series 5: sag soft · sagittal · 0.33mm/px · 3 of 58 slices shown]
[im 20/58  brain]
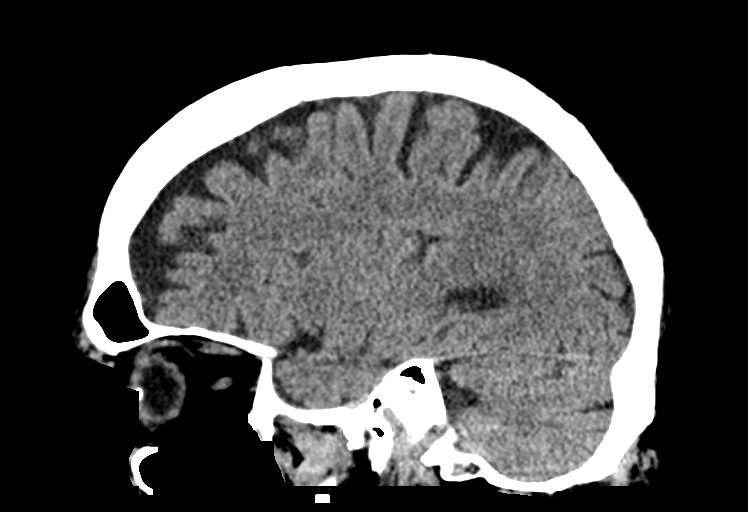
[im 29/58  brain]
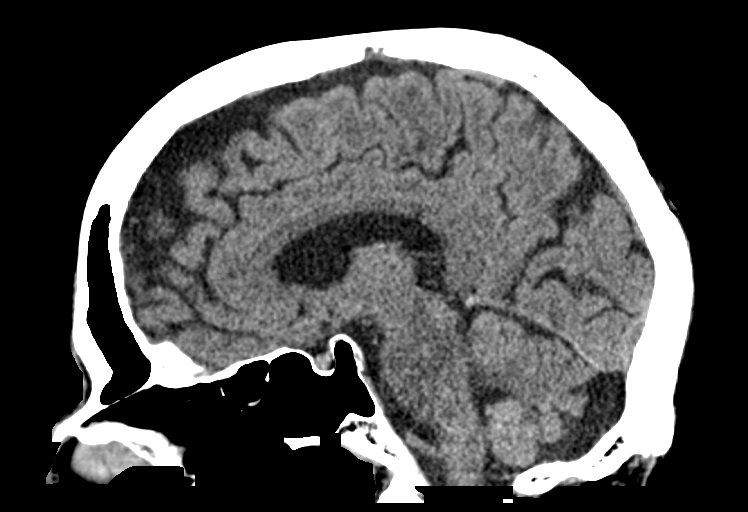
[im 39/58  brain]
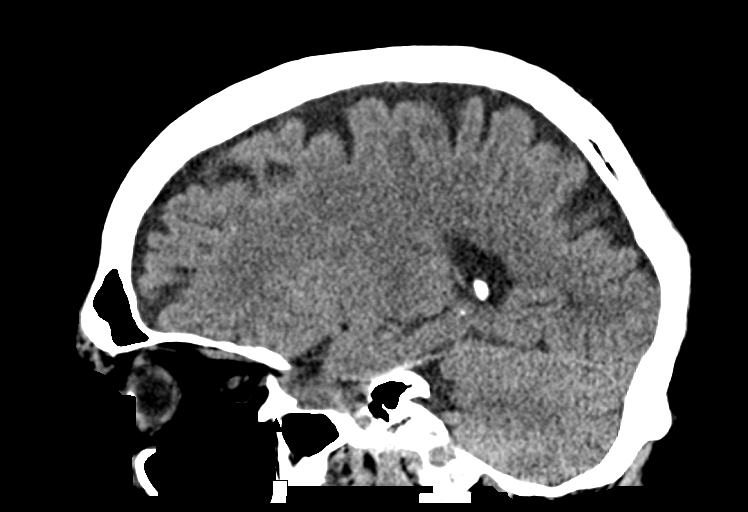

[15 of 47 positions shown; findings below may reference images not displayed]

FINDINGS: Brain: There is mild diffuse atrophy. There is no intracranial mass,
hemorrhage, extra-axial fluid collection, or midline shift. There is
mild small vessel disease in the centra semiovale bilaterally. No
acute infarct is demonstrable on this study.

Vascular: There is no hyperdense vessel. There are foci of
calcification in each carotid siphon region.

Skull: Bony calvarium appears intact. There is a right frontal scalp
hematoma.

Sinuses/Orbits: There is mucosal thickening in several ethmoid air
cells. Other visualized paranasal sinuses are clear. There is mild
soft tissue swelling slightly superior to the right orbit. Orbits
otherwise appear symmetric bilaterally.

Other: Visualized mastoid air cells are clear.
IMPRESSION: 1. Atrophy with mild periventricular small vessel disease. No acute
infarct. No mass or hemorrhage.

2.  There are foci of arterial vascular calcification.

3. Right frontal scalp hematoma and soft tissue swelling superior to
the right orbit. No bony abnormality. No intraorbital lesion
evident.

4.  Mucosal thickening in several ethmoid air cells.

## 2020-02-22 ENCOUNTER — Telehealth: Payer: Self-pay | Admitting: Neurology

## 2020-02-22 NOTE — Telephone Encounter (Signed)
You can get prior auth for myobloc (for drooling) and get him put on the botox schedule

## 2020-02-22 NOTE — Telephone Encounter (Signed)
Patient would like to receive Botox injections. Per his AVS from his last visit, he was instructed to call and let Dr Tat know.

## 2020-02-23 NOTE — Telephone Encounter (Signed)
Tee when you are able to get the approval for Myobloc please let me know and I will get him on the schedule

## 2020-02-25 ENCOUNTER — Telehealth: Payer: Self-pay

## 2020-02-25 NOTE — Telephone Encounter (Signed)
Referral  Type of referral: Physical & Speech Therapy  Provider Office/ Name: [facility]  Phone: [phone]  Fax: [phone]  Address: [address] [place], [place], [place] [address]  Appointment Date and Time: 01/05/2020

## 2020-02-25 NOTE — Telephone Encounter (Signed)
Patient discharged from physical therapy on 01/13/2020 because patient stated therapy was too hard for him. As of right now he will continue speech therapy.

## 2020-03-07 ENCOUNTER — Encounter: Payer: Self-pay | Admitting: Neurology

## 2020-03-07 NOTE — Progress Notes (Addendum)
Ran Benefits Verification through Myobloc ([employer]) website. Can use Buy and bill or [facility] ([facility]). Myobloc covered under Medical Benefits but does require PA. Call [phone] initiate PA.  Per phone call with [place] is actually the preferred. Sent msg to Tat about this.   PA for Xeomin approved valid from 03/07/20 to 03/07/21. Valid for 4 visits. Auth #: [id number].   [facility] not [facility] any [facility]- unable to use SP so only option is to buy and bill for patient.

## 2020-03-17 ENCOUNTER — Ambulatory Visit (INDEPENDENT_AMBULATORY_CARE_PROVIDER_SITE_OTHER): Payer: Medicare HMO | Admitting: Neurology

## 2020-03-17 ENCOUNTER — Other Ambulatory Visit: Payer: Self-pay

## 2020-03-17 DIAGNOSIS — K117 Disturbances of salivary secretion: Secondary | ICD-10-CM | POA: Diagnosis not present

## 2020-03-17 MED ORDER — INCOBOTULINUMTOXINA 100 UNITS IM SOLR
100.0000 [IU] | INTRAMUSCULAR | Status: DC
  Administered 2020-03-17: 100 [IU] via INTRAMUSCULAR

## 2020-03-17 NOTE — Procedures (Signed)
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  The patient understands that this warning states that there have been reported cases of the Botox extending beyond the injection site and creating adverse effects, similar to those of botulism. This included loss of strength, trouble walking, hoarseness, trouble saying words clearly, loss of bladder control, trouble breathing, trouble swallowing, diplopia, blurry vision and ptosis. Most of the distant spread of Botox was happening in patients, primarily children, who received medication for spasticity or for cervical dystonia. The patient expressed understanding and desire to proceed.   Injections  Location Left  Right Units Number of sites  Parotid (point 1 on picture) 30 30 60 1 per side  Submandibular (point 2) 20 20 40 1 per side  TOTAL UNITS:   100    Type of Toxin: Xeomin Discarded Units: 0  Needle drawback with each injection was free of blood. Pt tolerated procedure well without complications.   Reinjection is anticipated in 4 months.

## 2020-04-26 ENCOUNTER — Encounter: Payer: Self-pay | Admitting: Neurology

## 2020-04-26 NOTE — Progress Notes (Addendum)
[facility] Key: [id number] - PA Case ID: [id number] Need help? Call us at [phone] Outcome Approvedtoday Request Reference Number: [id number]. MYOBLOC INJ 5000/ML is approved through 07/27/2020. Your patient may now fill this prescription and it will be covered. Drug Myobloc 5000UNIT/ML solution Form [employer] Medicare Part D Electronic Prior Authorization Form (2017 NCPDP)

## 2020-04-26 NOTE — Progress Notes (Addendum)
Per BV: [facility]  11/17- called to set up account and give verbal script for Myobloc. [facility] said that [facility] is no longer [facility] insurance. We would only have the option to [place].

## 2020-06-19 NOTE — Progress Notes (Signed)
Virtual Visit Via Video   The purpose of this virtual visit is to provide medical care while limiting exposure to the novel coronavirus.    Consent was obtained for video visit:  Yes.   Answered questions that patient had about telehealth interaction:  Yes.   I discussed the limitations, risks, security and privacy concerns of performing an evaluation and management service by telemedicine. I also discussed with the patient that there may be a patient responsible charge related to this service. The patient expressed understanding and agreed to proceed.  Pt location: Home Physician Location: office Name of referring provider:  [place] connected with [name] at patients initiation/request on 06/22/2020 at  3:30 PM EST by video enabled telemedicine application and verified that I am speaking with the correct person using two identifiers. Pt MRN:  [id number] Pt DOB:  04/28/1941 Video Participants:  [name];  Wife supplements hx  Assessment/Plan:   1.  Parkinsons Disease, diagnosed 2011             -Continue carbidopa/levodopa 25/100, 2 tablets 4 times per day but change dosing times to 9am/noon/3pm/6pm.  Can take extra 1/2 tablet prn             -Continue carbidopa/levodopa 50/200 nightly             -Continue entacapone 200 mg, 1 tablet 3 times per day.  Could consider holding to see if dry mouth gets better.  We discussed today and decided together to continue  -Discussed with patient the importance of starting an exercise program.  2.  B12 deficiency             -on injections, with last injection on December 3 per records  3.  Iron def anemia             -on supplement  4.  GAD             -some related to pandemic.  Some related to move from [facility] and not having network of people             -Patient now following with psychiatry.  On fairly high-dose mirtazapine, 45 mg at bedtime as well as lorazepam 0.5 mg, but has reduced this from 4 times per day to  twice per day.  Discussed with patient that I really do not care for this dose of lorazepam in my aging, Parkinson's population.  PDMP reviewed.   5.  Sialorrhea             -Failed Xeomin  -Has upcoming trial of Myobloc in February  6.  Polypharmacy  -We will need to watch closely the fact that the patient is getting morphine (sublingual) as well as Ativan.  I am concerned about this, both in this age group as well as patients with Parkinson's disease.  He and I discussed this in detail.  Discussed fall risk.  Discussed risk for confusion and hallucinations. Subjective:   Jared Schaefer was seen today in follow up for Parkinsons disease.  My previous records were reviewed prior to todays visit as well as outside records available to me. Pt denies falls.  Pt denies lightheadedness, near syncope.  No hallucinations.  Patient follows with psychiatry through [facility].  Last seen on November 30.  Not exercising.   He takes an extra 1/2 tablet of levodopa qod for "anxiety" or inner tremor.    Current prescribed movement disorder medications: Carbidopa/levodopa 25/100, 2 po qid (  8am but isn't up for the day until 9-9:30/noon/4pm/8pm) Carbidopa/levodopa 50/200 q hs (10pm) Entacapone, 200 tid B12 inj Mirtazapine, 45 mg at bedtime (prescribed by psychiatry) Lorazepam 0.5 mg 4 times per day (prescribed by psychiatry -received number 360 tablets on January 3 per PDMP but pt states that going through 2 per day and pdmp refills confirm that is correct)    ALLERGIES:  No Known Allergies  CURRENT MEDICATIONS:  Outpatient Encounter Medications as of 06/22/2020  Medication Sig  . carbidopa-levodopa (SINEMET CR) 50-200 MG tablet Take 1 tablet by mouth at bedtime.  . carbidopa-levodopa (SINEMET IR) 25-100 MG tablet Take 2 tablets by mouth 4 (four) times daily.   . Cholecalciferol (VITAMIN D3 SUPER STRENGTH) 50 MCG (2000 UT) TABS Take 1 tablet by mouth daily.   . cyanocobalamin (,VITAMIN B-12,) 1000  MCG/ML injection Inject 1,000 mcg into the muscle every 30 (thirty) days.   . fenofibrate (TRICOR) 145 MG tablet Take 145 mg by mouth daily.  . folic acid (FOLVITE) 1 MG tablet Take 1 mg by mouth 2 (two) times daily.  Marland Kitchen LORazepam (ATIVAN) 0.5 MG tablet Take 1 tablet by mouth 4 (four) times daily as needed.   . mirtazapine (REMERON) 45 MG tablet Take 1 tablet by mouth at bedtime.   . Multiple Vitamin (MULTIVITAMIN WITH MINERALS) TABS tablet Take 1 tablet by mouth daily.  . OPIUM TINCTURE, PAREGORIC, PO Take 2 drops by mouth as needed.  . sulfaSALAzine (AZULFIDINE) 500 MG tablet Take 3,000 mg by mouth daily.  . [DISCONTINUED] entacapone (COMTAN) 200 MG tablet Take 1 tablet by mouth 3 (three) times daily. (Patient not taking: Reported on 06/22/2020)  . [DISCONTINUED] Ferrous Sulfate (IRON PO) Take 1 tablet by mouth daily. Slow release (Patient not taking: Reported on 06/22/2020)  . [DISCONTINUED] Melatonin 3 MG CAPS Take 1 capsule by mouth at bedtime as needed. (Patient not taking: Reported on 06/22/2020)   Facility-Administered Encounter Medications as of 06/22/2020  Medication  . incobotulinumtoxinA (XEOMIN) 100 units injection 100 Units    Objective:   PHYSICAL EXAMINATION:    VITALS:   Vitals:   06/22/20 1405  Weight: 195 lb (88.5 kg)  Height: 6' 1"  (1.854 m)    .GEN:  The patient appears stated age and is in NAD.  Neurological examination:  Orientation: The patient is alert and oriented x3. Cranial nerves: There is good facial symmetry. There is facial hypomimia.  The speech is fluent and clear. Soft palate rises symmetrically and there is no tongue deviation. Hearing is intact to conversational tone. Motor: Strength is at least antigravity x 4.   Shoulder shrug is equal and symmetric.  There is no pronator drift.  Movement examination: Tone: unable Abnormal movements: None seen, but hands not seen well at rest. Coordination:  There is mild decremation with RAM's Gait and  Station: The patient pushes off of the chair to arise.  He is able to walk fairly well in his home.   Follow up Instructions      -I discussed the assessment and treatment plan with the patient. The patient was provided an opportunity to ask questions and all were answered. The patient agreed with the plan and demonstrated an understanding of the instructions.   The patient was advised to call back or seek an in-person evaluation if the symptoms worsen or if the condition fails to improve as anticipated.    Total time spent on today's visit was 40 minutes, including both face-to-face time and nonface-to-face time.  Time  included that spent on review of records (prior notes available to me/labs/imaging if pertinent), discussing treatment and goals, answering patient's questions and coordinating  Cc:  [place], [place]

## 2020-06-22 ENCOUNTER — Other Ambulatory Visit: Payer: Self-pay

## 2020-06-22 ENCOUNTER — Telehealth (INDEPENDENT_AMBULATORY_CARE_PROVIDER_SITE_OTHER): Payer: Medicare HMO | Admitting: Neurology

## 2020-06-22 ENCOUNTER — Encounter: Payer: Self-pay | Admitting: Neurology

## 2020-06-22 VITALS — Ht 73.0 in | Wt 195.0 lb

## 2020-06-22 DIAGNOSIS — G20 Parkinson's disease: Secondary | ICD-10-CM | POA: Diagnosis not present

## 2020-06-22 DIAGNOSIS — K117 Disturbances of salivary secretion: Secondary | ICD-10-CM | POA: Diagnosis not present

## 2020-06-22 DIAGNOSIS — F411 Generalized anxiety disorder: Secondary | ICD-10-CM | POA: Diagnosis not present

## 2020-06-27 MED ORDER — CARBIDOPA-LEVODOPA ER 50-200 MG PO TBCR: 1.0000 | EXTENDED_RELEASE_TABLET | Freq: Every day | ORAL | 1 refills | Status: DC

## 2020-06-27 MED ORDER — ENTACAPONE 200 MG PO TABS: 200.0000 mg | ORAL_TABLET | Freq: Three times a day (TID) | ORAL | 1 refills | Status: DC

## 2020-06-27 MED ORDER — CARBIDOPA-LEVODOPA 25-100 MG PO TABS: 2.0000 | ORAL_TABLET | Freq: Four times a day (QID) | ORAL | 1 refills | Status: DC

## 2020-07-21 ENCOUNTER — Other Ambulatory Visit: Payer: Self-pay

## 2020-07-21 ENCOUNTER — Ambulatory Visit (INDEPENDENT_AMBULATORY_CARE_PROVIDER_SITE_OTHER): Payer: Medicare HMO | Admitting: Neurology

## 2020-07-21 DIAGNOSIS — K117 Disturbances of salivary secretion: Secondary | ICD-10-CM

## 2020-07-21 MED ORDER — RIMABOTULINUMTOXINB 5000 UNIT/ML IM SOLN
5000.0000 [IU] | Freq: Once | INTRAMUSCULAR | Status: AC
  Administered 2020-07-21: 5000 [IU] via INTRAMUSCULAR

## 2020-07-21 NOTE — Procedures (Signed)
Botulinum Clinic    History:  Diagnosis: Sialorrhea    Result History  N/a.  Previously failed xeomin  Consent obtained from: The patient The patient was educated on the botulinum toxin the black blox warning and given a copy of the botox patient medication guide.  The patient understands that this warning states that there have been reported cases of the Botox extending beyond the injection site and creating adverse effects, similar to those of botulism. This included loss of strength, trouble walking, hoarseness, trouble saying words clearly, loss of bladder control, trouble breathing, trouble swallowing, diplopia, blurry vision and ptosis. Most of the distant spread of Botox was happening in patients, primarily children, who received medication for spasticity or for cervical dystonia. The patient expressed understanding and desire to proceed.     Injections  Location Left  Right Units Number of sites  Submandibular gland 250 250 500 1 per side  Parotid 2250 2250 2500 1 per side  TOTAL UNITS:     5000      Type of Toxin: Myobloc type B As ordered and injected IM at today's visit Total Units: 5000  Discarded Units: 0  Needle drawback with each injection was free of blood. Pt tolerated procedure well without complications.   Reinjection is anticipated in 3 months.

## 2020-09-25 NOTE — Progress Notes (Signed)
[facility] Key: [id number] - PA Case ID: [id number] Need help? Call us at [phone] Outcome Approvedtoday Request Reference Number: [id number]. MYOBLOC INJ 5000/ML is approved through 12/25/2020. Your patient may now fill this prescription and it will be covered.

## 2020-10-04 DIAGNOSIS — M216X2 Other acquired deformities of left foot: Secondary | ICD-10-CM | POA: Insufficient documentation

## 2020-10-20 ENCOUNTER — Other Ambulatory Visit: Payer: Self-pay

## 2020-10-20 ENCOUNTER — Ambulatory Visit: Payer: Medicare HMO | Admitting: Neurology

## 2020-10-20 DIAGNOSIS — K117 Disturbances of salivary secretion: Secondary | ICD-10-CM

## 2020-10-20 MED ORDER — RIMABOTULINUMTOXINB 5000 UNIT/ML IM SOLN
5000.0000 [IU] | Freq: Once | INTRAMUSCULAR | Status: AC
  Administered 2020-10-20: 5000 [IU] via INTRAMUSCULAR

## 2020-10-20 NOTE — Procedures (Signed)
Botulinum Clinic    History:  Diagnosis: Sialorrhea    Result History  Previously failed xeomin Did well with myobloc but now mouth so dry using biotene.  Doesn't want to decrease dose  Consent obtained from: The patient The patient was educated on the botulinum toxin the black blox warning and given a copy of the botox patient medication guide.  The patient understands that this warning states that there have been reported cases of the Botox extending beyond the injection site and creating adverse effects, similar to those of botulism. This included loss of strength, trouble walking, hoarseness, trouble saying words clearly, loss of bladder control, trouble breathing, trouble swallowing, diplopia, blurry vision and ptosis. Most of the distant spread of Botox was happening in patients, primarily children, who received medication for spasticity or for cervical dystonia. The patient expressed understanding and desire to proceed.     Injections  Location Left  Right Units Number of sites  Submandibular gland 250 250 500 1 per side  Parotid 2250 2250 2500 1 per side  TOTAL UNITS:     5000      Type of Toxin: Myobloc type B As ordered and injected IM at today's visit Total Units: 5000  Discarded Units: 0  Needle drawback with each injection was free of blood. Pt tolerated procedure well without complications.   Reinjection is anticipated in 3 months.

## 2020-11-10 NOTE — Progress Notes (Signed)
Assessment/Plan:   1.  Parkinsons Disease  -Continue carbidopa/levodopa 25/100, 2 tablets at 9 AM/noon/3 PM/6 PM.  Can take an extra 1/2 po bid prn  -Continue entacapone, 200 mg with first 3 dosages of levodopa  -Continue carbidopa/levodopa 50/200 at bedtime  2.  History of B12 deficiency  -On injections  3.  History of iron deficiency anemia  -On supplementation  4. GAD  -Following with psychiatry.  -Patient has been working on backing down lorazepam over the course of time.  He is on a fairly high dose of mirtazapine, 45 mg at bedtime.  5.  Sialorrhea  -On Myobloc - will decrease dosage next time to 2500 units.  Failed Xeomin in the past.   Subjective:   Jared Schaefer was seen today in follow up for Parkinsons disease.  My previous records were reviewed prior to todays visit as well as outside records available to me. Pt with wife who supplements the history.  Pt denies falls.  Pt with some lightheadedness (happens when upset) but no near syncope.  No hallucinations.  Mood has been good.  We did start him on Myobloc for drooling, last injections being in May.  He had previously failed Xeomin.  Reports that his mouth is too dry - using biotene.  Psychiatry notes are reviewed from March 18.  Patient has been weaning down on the lorazepam.  He is walking for exercise.    Current prescribed movement disorder medications: Carbidopa/levodopa 25/100, 2 tablets at 9 AM/noon/3 PM/6 PM (he takes 1/2 po bid prn per patient) - does that 3-4 days per week Carbidopa/levodopa 50/200 at bedtime Entacapone 200 mg 3 times per day Mirtazapine, 45 mg at bedtime (prescribed by psychiatry) Lorazepam 0.5 mg 4 times per day (prescribed by psychiatry -patient reports only taking twice per day)    ALLERGIES:  No Known Allergies  CURRENT MEDICATIONS:  Outpatient Encounter Medications as of 11/14/2020  Medication Sig  . carbidopa-levodopa (SINEMET CR) 50-200 MG tablet Take 1 tablet by mouth at  bedtime.  . carbidopa-levodopa (SINEMET IR) 25-100 MG tablet Take 2 tablets by mouth 4 (four) times daily. 9am/noon/3pm/6pm (Patient taking differently: Take 2 tablets by mouth 4 (four) times daily. 9am/noon/3pm/6pm and 1/2 tab twice/day as needed for wearing off)  . Cholecalciferol (VITAMIN D3 SUPER STRENGTH) 50 MCG (2000 UT) TABS Take 1 tablet by mouth daily.   . cyanocobalamin (,VITAMIN B-12,) 1000 MCG/ML injection Inject 1,000 mcg into the muscle every 30 (thirty) days.   . entacapone (COMTAN) 200 MG tablet Take 1 tablet (200 mg total) by mouth 3 (three) times daily.  . fenofibrate (TRICOR) 145 MG tablet Take 145 mg by mouth daily.  . folic acid (FOLVITE) 1 MG tablet Take 1 mg by mouth 2 (two) times daily.  Marland Kitchen LORazepam (ATIVAN) 0.5 MG tablet Take 1 tablet by mouth 4 (four) times daily as needed.   . mirtazapine (REMERON) 45 MG tablet Take 1 tablet by mouth at bedtime.   . Multiple Vitamin (MULTIVITAMIN WITH MINERALS) TABS tablet Take 1 tablet by mouth daily.  . OPIUM TINCTURE, PAREGORIC, PO Take 2 drops by mouth as needed.  . sulfaSALAzine (AZULFIDINE) 500 MG tablet Take 3,000 mg by mouth daily.   Facility-Administered Encounter Medications as of 11/14/2020  Medication  . incobotulinumtoxinA (XEOMIN) 100 units injection 100 Units    Objective:   PHYSICAL EXAMINATION:    VITALS:   Vitals:   11/14/20 1408  BP: 124/84  Pulse: 75  SpO2: 95%  Weight: 187  lb (84.8 kg)  Height: 6' 1"  (1.854 m)    GEN:  The patient appears stated age and is in NAD. HEENT:  Normocephalic, atraumatic.  The mucous membranes are moist. The superficial temporal arteries are without ropiness or tenderness. CV:  RRR Lungs:  CTAB Neck/HEME:  There are no carotid bruits bilaterally.  Neurological examination:  Orientation: The patient is alert and oriented x3. Cranial nerves: There is good facial symmetry withfacial hypomimia. The speech is fluent and clear.  He has mild facial hypomimia.  Soft palate rises  symmetrically and there is no tongue deviation. Hearing is intact to conversational tone. Sensation: Sensation is intact to light touch throughout Motor: Strength is at least antigravity x4.  Movement examination: Tone: There is mild increased tone in the RLE Abnormal movements: none Coordination:  There is no decremation with RAM's, with any form of RAMS, including alternating supination and pronation of the forearm, hand opening and closing, finger taps, heel taps and toe taps. Gait and Station: Patient pushes off of the chair to arise.  The patient's stride length is good but he has decreased arm swing on the R.    Total time spent on today's visit was 24 minutes, including both face-to-face time and nonface-to-face time.  Time included that spent on review of records (prior notes available to me/labs/imaging if pertinent), discussing treatment and goals, answering patient's questions and coordinating care.  Cc:  [employer], [place]

## 2020-11-14 ENCOUNTER — Encounter: Payer: Self-pay | Admitting: Neurology

## 2020-11-14 ENCOUNTER — Other Ambulatory Visit: Payer: Self-pay

## 2020-11-14 ENCOUNTER — Ambulatory Visit: Payer: Medicare HMO | Admitting: Neurology

## 2020-11-14 VITALS — BP 124/84 | HR 75 | Ht 73.0 in | Wt 187.0 lb

## 2020-11-14 DIAGNOSIS — G20 Parkinson's disease: Secondary | ICD-10-CM | POA: Diagnosis not present

## 2020-11-14 MED ORDER — CARBIDOPA-LEVODOPA ER 50-200 MG PO TBCR: 1.0000 | EXTENDED_RELEASE_TABLET | Freq: Every day | ORAL | 1 refills | Status: DC

## 2020-11-14 MED ORDER — ENTACAPONE 200 MG PO TABS: 200.0000 mg | ORAL_TABLET | Freq: Three times a day (TID) | ORAL | 1 refills | Status: DC

## 2020-11-14 MED ORDER — CARBIDOPA-LEVODOPA 25-100 MG PO TABS: ORAL_TABLET | ORAL | 1 refills | Status: DC

## 2020-11-14 NOTE — Patient Instructions (Signed)
Online Resources for Power over Parkinson's Group May 2022  . Local [place] Online Groups  o Power over [employer] :    - Upcoming Power over [employer]:  2nd Wednesdays of the month at 2 pm:  June 8th, July 13th - Contact Amy Marriott at [email or web address] if interested in participating in this group o Parkinson's Care Partners Group:    3rd Mondays, Contact Misty Paladino o Atypical Parkinsonian Patient Group:   4th Wednesdays, Contact Misty Paladino o If you are interested in participating in these groups with Misty, please contact her directly for how to join those meetings.  Her contact information is [email or web address].   . [place]:  [email or web address] o PD Health at Home continues:  Mindfulness Mondays, Expert Briefing Tuesdays, Wellness Wednesdays, Take Time Thursdays, Fitness Fridays o Register for Armed forces operational officer) at [email or web address] o  Please check out their website to sign up for emails and see their full online offerings  . [place]:  [email or web address]  o Check out additional information on their website to see their full online offerings  . [place]:  [email or web address] o Upcoming Webinar:  Stay tuned o Care Partner Monthly Meetup.  With [place].  First Tuesday of each month, 2 pm o Joy Breaks:  First Wednesday of each month, 2-3 pm. There will be art, doodling, making, crafting, listening, laughing, stories, and everything in between. No art experience necessary. No supplies required. Just show up for joy!  Register on their website. o Check out additional information to Live Well Today on their website  . Parkinson and Movement Disorders (PMD) Alliance:  www.[email or web address] o [employer] Online:  Online Education Events o Sign up for emails, which are sent weekly to give you updates on programming and online  offerings     . Parkinson's Association of the [place]:  www.[email or web address] o Information on online support groups, education events, and online exercises including Yoga, Parkinson's exercises and more-LOTS of information on links to PD resources and online events o Virtual Support Group through Parkinson's Association of the [place]; next one is scheduled for Wednesday, May 4th, 2022 at 2 pm. (These are typically scheduled for the 1st Wednesday of the month at 2 pm).  Visit website for details.  . Additional links for movement activities: o PWR! Moves Classes at [facility] RESUMED!  Wednesdays 10 and 11 am.  Contact Amy Marriott, PT [email or web address] or [phone] if interested o Here is a link to the PWR!Moves classes on Zoom from [phone] - Daily Mon-Sat at 10:00. Via Zoom, FREE and open to all.  There is also a link below via Facebook if you use that platform. - [place] - [email or web address] o Parkinson's Wellness Recovery (PWR! Moves)  www.[email or web address] - Info on the PWR! Virtual Experience:  You will have access to our expertise through self-assessment, guided plans that start with the PD-specific fundamentals, educational content, tips, Q&A with an expert, and a growing PrepaidParty.no of PD-specific pre-recorded and live exercise classes of varying types and intensity - both physical and cognitive! If that is not enough, we offer 1:1 wellness consultations (in-person or virtual) to personalize your PWR! Art therapist.  - Check out the PWR! Move of the month on the [place] [facility] website:  [address] o [email or web address] Fridays:  - As part of the PD Health @ Home program,  this free video series focuses each week on one aspect of fitness designed to support people living with Parkinson's.  These weekly videos highlight the [place] recent fitness guidelines for people with Parkinson's disease. -  HollywoodSale.dk o Dance for PD website is offering free, live-stream classes throughout the week, as well as links to [employer] of classes:  [email or web address] o Dance for Parkinson's Class:  [place].  Free offering for people with Parkinson's and care partners; virtual class.  o For more information, contact [phone] or email Ruffin Frederick at [email or web address] o Virtual dance and Pilates for Parkinson's classes: Click on the Community Tab> Parkinson's Movement Initiative Tab.  To register for classes and for more information, visit [email or web address] and click the "community" tab.     o [employer] Parkinson's Cycling Classes  - [employer]: 1pm on Fridays-Live classes at [employer] ([facility] [email or web address] or [phone]) [facility] [employer]: Virtual Classes Mondays and Thursdays Jeanette Caprice classes Tuesday, Wednesday and Thursday (contact [place] at [email or web address]  or [phone])  o [facility] Boxing - Three levels of classes are offered Tuesdays and Thursdays:  10:30 am,  12 noon & 1:45 pm at [facility] ([place]).  - Active Stretching with Paula Compton Class starting in March, on Fridays - To observe a class or for  more information, call [phone] or email [email or web address] . Well-Spring [employer]: o Chief Technology Officer Opportunities:  www.well-[email or web address]/caregiver-education/caregiver-support-group.  You may also contact Vickki Muff at [email or web address] or [phone].   o Well-Spring Navigator:  Just1Navigator program, a free service  to help individuals and families through the journey of determining care for older adults.  The "Navigator" is a [phone], Education officer, museum, who will speak with a prospective client and/or loved ones to provide an assessment of the situation and a set of recommendations for a personalized care plan -- all free of charge, and whether Well-Spring [employer] offers the needed service or not. If the need is not a service we provide, we are well-connected with reputable programs in town that we can refer you to.  www.well-springsolutions.org or to speak with the Navigator, call [phone].

## 2021-01-08 ENCOUNTER — Other Ambulatory Visit: Payer: Self-pay | Admitting: Neurology

## 2021-01-15 ENCOUNTER — Other Ambulatory Visit: Payer: Self-pay

## 2021-01-19 ENCOUNTER — Ambulatory Visit: Payer: Medicare HMO | Admitting: Neurology

## 2021-01-19 ENCOUNTER — Telehealth: Payer: Self-pay | Admitting: Neurology

## 2021-01-19 ENCOUNTER — Other Ambulatory Visit: Payer: Self-pay | Admitting: Neurology

## 2021-01-19 ENCOUNTER — Other Ambulatory Visit: Payer: Self-pay

## 2021-01-19 DIAGNOSIS — K117 Disturbances of salivary secretion: Secondary | ICD-10-CM

## 2021-01-19 MED ORDER — RIMABOTULINUMTOXINB 5000 UNIT/ML IM SOLN
7500.0000 [IU] | Freq: Once | INTRAMUSCULAR | Status: AC
  Administered 2021-01-19: 5000 [IU] via INTRAMUSCULAR

## 2021-01-19 NOTE — Telephone Encounter (Addendum)
During botox appt, pt stated that he sent message re: hallucinations that I had not answered b/c I was on vacation.  Told him I would review chart and get back with him.  I did ask him about the hallucinations, and it turns out that those really were vivid dreams and not necessarily hallucinations.  He gave me a medication list and noted that Ativan was on the list and told him that there may be some medication for vivid dreams (speaking of clonazepam) but that it interacts with the lorazepam.  He noted that lorazepam may not be the right medication for him.  Mahina, let patient know that I did review his chart.  When he emailed me back in July, I did email him back and he told me that he thought his hallucinations were from the death of his brother.  He noted that they were far less frequent when I emailed him back on July 15th, and he answered me on July 28.  As above, regarding treatment, his lorazepam would have to be changed to clonazepam (nighttime only).  Because of the fact that I do not prescribe his lorazepam and his psychiatrist has been trying to reduce it, it is something that he will need to talk about with her/him.  That would likely help the nighttime dreams (that he calls hallucinations).  Also, wife told me during botox he needed refill on carbidopa/levodopa CR q hs and the IR in the daytime but both were refilled x 6 months in June.  They should have enough until December.  Confirm with wife

## 2021-01-19 NOTE — Progress Notes (Signed)
Botulinum Clinic    History:  Diagnosis: Sialorrhea    Result History  Failed Xeomin.  Thought last Myobloc made his mouth dry and wanted to decrease the dose, but then emailed me and had changed his mind.  We stated with the higher dose.  Consent obtained from: The patient The patient was educated on the botulinum toxin the black blox warning and given a copy of the botox patient medication guide.  The patient understands that this warning states that there have been reported cases of the Botox extending beyond the injection site and creating adverse effects, similar to those of botulism. This included loss of strength, trouble walking, hoarseness, trouble saying words clearly, loss of bladder control, trouble breathing, trouble swallowing, diplopia, blurry vision and ptosis. Most of the distant spread of Botox was happening in patients, primarily children, who received medication for spasticity or for cervical dystonia. The patient expressed understanding and desire to proceed.     Injections  Location Left  Right Units Number of sites  Submandibular gland 250 250 500 1 per side  Parotid 2250 2250 2500 1 per side  TOTAL UNITS:     5000      Type of Toxin: Myobloc type B As ordered and injected IM at today's visit Total Units: 5000  Discarded Units: 0  Needle drawback with each injection was free of blood. Pt tolerated procedure well without complications.   Reinjection is anticipated in 3 months.

## 2021-01-23 NOTE — Telephone Encounter (Signed)
Called patient&wife and left a message for a call back.

## 2021-01-26 NOTE — Telephone Encounter (Signed)
Pt is calling mahina back

## 2021-01-26 NOTE — Telephone Encounter (Signed)
Called patient and his wife and left a message for a call back.

## 2021-01-30 NOTE — Telephone Encounter (Signed)
Called and spoke to patients wife and informed her:  "Jared Schaefer, let patient know that I did review his chart.  When he emailed me back in July, I did email him back and he told me that he thought his hallucinations were from the death of his brother.  He noted that they were far less frequent when I emailed him back on July 15th, and he answered me on July 28.  As above, regarding treatment, his lorazepam would have to be changed to clonazepam (nighttime only).  Because of the fact that I do not prescribe his lorazepam and his psychiatrist has been trying to reduce it, it is something that he will need to talk about with her/him.  That would likely help the nighttime dreams (that he calls hallucinations).  Also, wife told me during botox he needed refill on carbidopa/levodopa CR q hs and the IR in the daytime but both were refilled x 6 months in June.  They should have enough until December.  Confirm with wife"  Patients wife verbalized understanding and will contact patients psychiatrist. Patients wife had no further questions or concerns.

## 2021-02-13 ENCOUNTER — Other Ambulatory Visit: Payer: Self-pay | Admitting: Neurology

## 2021-02-13 DIAGNOSIS — G20 Parkinson's disease: Secondary | ICD-10-CM

## 2021-03-16 ENCOUNTER — Other Ambulatory Visit: Payer: Self-pay | Admitting: Neurology

## 2021-03-16 DIAGNOSIS — G20 Parkinson's disease: Secondary | ICD-10-CM

## 2021-03-16 DIAGNOSIS — K117 Disturbances of salivary secretion: Secondary | ICD-10-CM

## 2021-03-19 ENCOUNTER — Other Ambulatory Visit: Payer: Self-pay

## 2021-03-26 ENCOUNTER — Telehealth: Payer: Self-pay | Admitting: Neurology

## 2021-03-26 NOTE — Telephone Encounter (Signed)
Pts psychiatrist, Dr. Paula Libra, left a VM that said he had no phone number to return his call except a clinic number and we would not be able to reach him there.  Said we could email him (not HIPAA compliant) to discuss care.  Noting that psychiatry wants to reduce levodopa.  As per previous phone messages I wanted them to change ativan to klonopin.  No release to speak with physician but no way (other than email) to get Tamera Stands of MD.

## 2021-04-03 ENCOUNTER — Telehealth: Payer: Self-pay | Admitting: Neurology

## 2021-04-03 NOTE — Telephone Encounter (Signed)
Called patients wife and let her know to stop the Entacapone medication she wanted me to reach out to Psychiatrist last name Paula Libra [phone]

## 2021-04-03 NOTE — Telephone Encounter (Signed)
Patient's wife called and said the patient has been having delusions for the last week.   The EMT's were there this morning.   She said his psychiatrist was supposed to be reaching out to Dr. Carles Collet to adjust his medication as of Friday but today is Tuesday and she'd not heard from either doctor's office yet.

## 2021-04-06 ENCOUNTER — Telehealth: Payer: Self-pay | Admitting: Neurology

## 2021-04-06 NOTE — Telephone Encounter (Signed)
I called the clinic and got hung up on initially.  Called back and they said he wasn't available.  Asked for [employer] as directed and they said that she wasn't available.  They tried several other nurses (Brandi/donna) and no one responded to the front desk personnel.  Told them to have them call me back.

## 2021-04-06 NOTE — Telephone Encounter (Signed)
-----   Message from Alvan sent at 04/06/2021 11:55 AM EDT ----- Dr. Tia Masker called in and wanted me to give you a message. He stated he changed the patient's prescription for Lorazepam to Clonazepam. He stated the AAGP recommends avoiding long acting diazopenes. He is curious to understand the rationale for the change. He would like for someone to let the patient know the care plan moving forward. He stated to contact Kerman Passey LPN at his clinic at [phone].

## 2021-04-12 ENCOUNTER — Telehealth: Payer: Self-pay | Admitting: Neurology

## 2021-04-12 NOTE — Telephone Encounter (Signed)
Patients wife had not much information to add to this message.  She said she had discontinued the Entacapone and that the Hallucinations have mostly stopped patient still pacing and stating that he doesn't feel good.  Patients wife calling to let me know of the change of Lorazepam to clonazepam which have already been documented.  I asked patients wife what other issues were going on as mentioned in note and she stated she was upset she could not get a sooner appointment with Dr. Carles Collet as they only have a Botox appointment for next week and I did let her know that she is correct that day would not be an appointment but a Botox procedure day only .  I did see if file patients wife has requested a Self Referral to [facility] for second opinion with possible transfer of care for her husband Jared Schaefer

## 2021-04-12 NOTE — Telephone Encounter (Signed)
Pt wife needs a call to discuss rogers medication. Needs to know how to discontinue his lorazepam, and some other issues.

## 2021-04-18 ENCOUNTER — Telehealth: Payer: Self-pay | Admitting: *Deleted

## 2021-04-18 ENCOUNTER — Other Ambulatory Visit: Payer: Self-pay | Admitting: Neurology

## 2021-04-18 ENCOUNTER — Other Ambulatory Visit (HOSPITAL_COMMUNITY): Payer: Self-pay

## 2021-04-18 ENCOUNTER — Encounter: Payer: Self-pay | Admitting: *Deleted

## 2021-04-18 MED ORDER — MYOBLOC 5000 UNIT/ML IM SOLN
INTRAMUSCULAR | 0 refills | Status: AC
  Filled 2021-04-18: qty 1, fill #0
  Filled 2021-04-18: qty 1, 84d supply, fill #0

## 2021-04-18 NOTE — Progress Notes (Signed)
His prior authorization was valid to 12/18/2020. I hit renew ns submitted a new PA on covermymeds  Jared Schaefer  Key: [id number] Status  Sent to [place] 5000UNIT/ML solution Form  [employer] Medicare Part D Electronic Prior Authorization Form (2017 NCPDP)

## 2021-04-18 NOTE — Telephone Encounter (Signed)
Jared Schaefer is running a test claim after we received prior authorization. [name] was having some difficulty and will call back once resolved to let us know if test claim was successful or not and to set  up delivery.

## 2021-04-18 NOTE — Progress Notes (Signed)
[facility] Key: [id number] - PA Case ID: [id number] Outcome  Approvedtoday Request Reference Number: [id number].  MYOBLOC INJ 5000/ML is approved through 07/19/2021. Your patient may now fill this prescription and it will be covered.  Approval letter sent to scan into patient's chart

## 2021-04-19 ENCOUNTER — Other Ambulatory Visit (HOSPITAL_COMMUNITY): Payer: Self-pay

## 2021-04-19 NOTE — Telephone Encounter (Signed)
[place] and spoke with [employer]. She is verifying the patient has made arrangements for co-pay for Myobloc and then will dispense the medication.

## 2021-04-20 ENCOUNTER — Other Ambulatory Visit: Payer: Self-pay

## 2021-04-20 ENCOUNTER — Other Ambulatory Visit (HOSPITAL_COMMUNITY): Payer: Self-pay

## 2021-04-20 ENCOUNTER — Ambulatory Visit: Payer: Medicare HMO | Admitting: Neurology

## 2021-04-20 DIAGNOSIS — K117 Disturbances of salivary secretion: Secondary | ICD-10-CM

## 2021-04-20 MED ORDER — RIMABOTULINUMTOXINB 5000 UNIT/ML IM SOLN
5000.0000 [IU] | Freq: Once | INTRAMUSCULAR | Status: AC
  Administered 2021-04-20: 5000 [IU] via INTRAMUSCULAR

## 2021-04-20 NOTE — Procedures (Signed)
Botulinum Clinic    History:  Diagnosis: Sialorrhea    Result History  Failed Xeomin.  Feels myobloc working pretty well "unless I get excited about something"  Consent obtained from: The patient The patient was educated on the botulinum toxin the black blox warning and given a copy of the botox patient medication guide.  The patient understands that this warning states that there have been reported cases of the Botox extending beyond the injection site and creating adverse effects, similar to those of botulism. This included loss of strength, trouble walking, hoarseness, trouble saying words clearly, loss of bladder control, trouble breathing, trouble swallowing, diplopia, blurry vision and ptosis. Most of the distant spread of Botox was happening in patients, primarily children, who received medication for spasticity or for cervical dystonia. The patient expressed understanding and desire to proceed.     Injections  Location Left  Right Units Number of sites  Submandibular gland 250 250 500 1 per side  Parotid 2250 2250 2500 1 per side  TOTAL UNITS:     5000      Type of Toxin: Myobloc type B As ordered and injected IM at today's visit Total Units: 5000  Discarded Units: 0  Needle drawback with each injection was free of blood. Pt tolerated procedure well without complications.   Reinjection is anticipated in 3 months.

## 2021-04-21 ENCOUNTER — Other Ambulatory Visit (HOSPITAL_COMMUNITY): Payer: Self-pay

## 2021-04-23 ENCOUNTER — Other Ambulatory Visit (HOSPITAL_COMMUNITY): Payer: Self-pay

## 2021-05-15 ENCOUNTER — Other Ambulatory Visit (HOSPITAL_COMMUNITY): Payer: Self-pay

## 2021-05-28 NOTE — Progress Notes (Signed)
Assessment/Plan:   1.  Parkinsons Disease -Continue carbidopa/levodopa 25/100, 2 tablets at 9 AM/noon/3 PM/6 PM.  Can take an extra 1/2 po bid prn.  Seems to be doing this fairly regularly, but that is okay.  Is working well.  Both patient and wife agree that motor control is good right now.  -Continue carbidopa/levodopa 50/200 at bedtime  -Entacapone has been discontinued because of hallucinations.  -Psychiatry had suggested decreasing levodopa, but discussed with patient that we generally do not do that unless other avenues do not work.  We will have significant sacrifice of motor control, although we obviously do not want him to have hallucinations.  However, he has not been on any medicine for hallucination at this point.  -Patient has upcoming appointment with Dr. Doy Hutching on December 29.  2.  History of B12 deficiency  -On injections  3.  History of iron deficiency anemia  -On supplementation  4. GAD  -Following with psychiatry.  -Psychiatry prescribing mirtazapine, 45 mg at bed  -Patient on clonazepam 0.5 mg daily.  This is a significant reduction from his lorazepam, 0.5 mg 4 times per day.  Proud of him for that.   5.  Sialorrhea  -Patient receiving Myobloc.  Last injections were November 11.  Failed Xeomin in the past.  6.  Parkinsons Disease hallucination  -as above, we will start nuplazid, 34 mg daily.  Discussed r/b/se, including black box warning.  Discussed that this medication generally only gets rid of hallucinations completely in about 13% of patients.  However, it often times will change perception of hallucinations.  It can take up to 6 weeks to work.  Understanding was expressed.  Patient and wife would like to try the medication.  Samples given.  Treatment form filled out and faxed.  7.  As above, patient has appointment with [facility] [place] [facility] coming up next week.  Tell them that I certainly have no objection to that, but we would not want him going to both groups  at the same time.  Patient would like to be seen more frequently than my group can accommodate, and discussed with them that since unfortunately I am the only movement disorder physician in our group, we just are unable to see patients more frequently than he is seen, with the exception of an emergency.  Discussed that we do have a PA in our group for these types of appointments.  Happy to help him transition care to a new group, but they understand reasoning for [place] not wanting him going back and forth.   Subjective:   Jared Schaefer was seen today in follow up for Parkinsons disease.  My previous records were reviewed prior to todays visit as well as outside records available to me. Pt with wife who supplements the history.  Patient on Myobloc for sialorrhea.  He had initially wanted to decrease the dose of that, but ultimately decided to go back to the full 5000 units.  Since our last visit, I did receive phone calls from the patient's psychiatrist (and appareciate attempts at care coordination) and tried multiple times to call the psychiatrist back.  However, when the psychiatrist left a message for me, he stated that I would not be able to get him at his office, and did not leave a cell phone number.  He left his personal email, which was not HIPAA compliant.  I did try to call him at the office several times, but as he stated, I was not able  to get him in.  I also tried to leave messages with his nursing staff, but was unable to get them as well (his Network engineer paged several staff but no one picked up the phone).  I did stop his entacapone because of hallucinations.  I also reviewed the medical records, including psychiatry records, that were available to me.  His psychiatrist suggested reducing levodopa, starting quetiapine or Nuplazid for hallucinations.  PMP is reviewed.  It appears patient is now on clonazepam at bedtime.    Pt denies falls.  He states that he isn't having hallucinations in day but wife  states that he is still having them.  He doesn't think that there was change with the d/c of the entacapone.  "I just think that they are bad dreams and I will go on in life."  Wife states that he will close the doors to make sure that no one is there.   Wife states that yesterday he started talking to someone in a chair and no one was there.  Pt does describe some illusions.  Pts wife does state that he sleeps a lot and she wonders if he is trying to escape.  Current prescribed movement disorder medications: Carbidopa/levodopa 25/100, 2 tablets at 9 AM/noon/3 PM/6 PM (he takes 1/2 po bid prn per patient) - does that most days - perhaps 3/4 of the days Carbidopa/levodopa 50/200 at bedtime Mirtazapine, 45 mg at bedtime (prescribed by psychiatry) Clonazepam, 0.5 mg daily  Prior medications: Entacapone (stopped because of hallucinations but didn't seem to change this)  ALLERGIES:  No Known Allergies  CURRENT MEDICATIONS:  Outpatient Encounter Medications as of 05/29/2021  Medication Sig   Botulinum Toxin Type B (MYOBLOC) 5000 UNIT/ML SOLN Inject into parotid/submandibular as per physician every 3 months   carbidopa-levodopa (SINEMET CR) 50-200 MG tablet TAKE 1 TABLET BY MOUTH AT  BEDTIME   carbidopa-levodopa (SINEMET IR) 25-100 MG tablet TAKE 2 TABLETS BY MOUTH AT  9AM, NOON, 3PM, AND 6PM.  MAY TAKE AN EXTRA 1/2  TABLET TWICE DAILY AS  NEEDED.   Cholecalciferol (VITAMIN D3 SUPER STRENGTH) 50 MCG (2000 UT) TABS Take 1 tablet by mouth daily.    clonazePAM (KLONOPIN) 0.5 MG tablet Take 0.25-0.5 mg by mouth at bedtime as needed.   cyanocobalamin (,VITAMIN B-12,) 1000 MCG/ML injection Inject 1,000 mcg into the muscle every 30 (thirty) days.    fenofibrate (TRICOR) 145 MG tablet Take 145 mg by mouth daily.   folic acid (FOLVITE) 1 MG tablet Take 1 mg by mouth 2 (two) times daily.   mirtazapine (REMERON) 45 MG tablet Take 1 tablet by mouth at bedtime.    Multiple Vitamin (MULTIVITAMIN WITH MINERALS)  TABS tablet Take 1 tablet by mouth daily.   OPIUM TINCTURE, PAREGORIC, PO Take 2 drops by mouth as needed.   sulfaSALAzine (AZULFIDINE) 500 MG tablet Take 3,000 mg by mouth daily.   [DISCONTINUED] entacapone (COMTAN) 200 MG tablet TAKE ONE (1) TABLET BY MOUTH 3 TIMES DAILY (Patient not taking: Reported on 05/29/2021)   [DISCONTINUED] LORazepam (ATIVAN) 0.5 MG tablet Take 1 tablet by mouth 4 (four) times daily as needed.  (Patient not taking: Reported on 05/29/2021)   No facility-administered encounter medications on file as of 05/29/2021.    Objective:   PHYSICAL EXAMINATION:    VITALS:   Vitals:   05/29/21 1258  BP: 132/78  Pulse: 80  SpO2: 97%  Weight: 184 lb (83.5 kg)  Height: 6' 1"  (1.854 m)     GEN:  The patient appears stated age and is in NAD. HEENT:  Normocephalic, atraumatic.  The mucous membranes are moist.   Neurological examination:  Orientation: The patient is alert and oriented x3.  Looks to wife for finer aspects of the history, and they disagree about certain aspects. Cranial nerves: There is good facial symmetry withfacial hypomimia. The speech is fluent and clear.  He has mild facial hypomimia.  Soft palate rises symmetrically and there is no tongue deviation. Hearing is intact to conversational tone. Sensation: Sensation is intact to light touch throughout Motor: Strength is at least antigravity x4.  Movement examination: Tone: There is normal tone in the upper and lower extremities. Abnormal movements: none Coordination:  There is no decremation with RAM's, with any form of RAMS, including alternating supination and pronation of the forearm, hand opening and closing, finger taps, heel taps and toe taps. Gait and Station: Patient pushes off of the chair to arise.  He ambulates well in the hall today.  Total time spent on today's visit was 42 minutes, including both face-to-face time and nonface-to-face time.  Time included that spent on review of records  (prior notes available to me/labs/imaging if pertinent), discussing treatment and goals, answering patient's questions and coordinating care.  Cc:  [place], [place]

## 2021-05-29 ENCOUNTER — Encounter: Payer: Self-pay | Admitting: Neurology

## 2021-05-29 ENCOUNTER — Ambulatory Visit: Payer: Medicare HMO | Admitting: Neurology

## 2021-05-29 ENCOUNTER — Telehealth: Payer: Self-pay | Admitting: Neurology

## 2021-05-29 ENCOUNTER — Other Ambulatory Visit: Payer: Self-pay

## 2021-05-29 VITALS — BP 132/78 | HR 80 | Ht 73.0 in | Wt 184.0 lb

## 2021-05-29 DIAGNOSIS — R441 Visual hallucinations: Secondary | ICD-10-CM

## 2021-05-29 DIAGNOSIS — G20 Parkinson's disease: Secondary | ICD-10-CM | POA: Diagnosis not present

## 2021-05-29 MED ORDER — NUPLAZID 34 MG PO CAPS: ORAL_CAPSULE | ORAL | 0 refills | Status: AC

## 2021-05-29 NOTE — Telephone Encounter (Signed)
Sam from [facility] called to get directions on the patient's Nuplazid prescription.   Also, they do not carry the medication so the prescription will need to be transferred or sent to a new pharmacy.

## 2021-05-29 NOTE — Telephone Encounter (Signed)
called pharmacy back to let them know that prescription was a sample prescription to ignore I

## 2021-06-08 ENCOUNTER — Telehealth: Payer: Self-pay

## 2021-06-08 NOTE — Telephone Encounter (Signed)
[facility] (Key: [id number]) Rx #: [id number] Nuplazid 34MG capsules   Form [employer] Medicare Part D Electronic Prior Authorization Form (2017 NCPDP) Created 2 days ago Sent to Plan 6 hours ago Plan Response 6 hours ago Submit Clinical Questions 6 hours ago Determination Favorable 6 hours ago NUPLAZID (pimavanserin) is available at the following 6 specialty pharmacies: - [facility]  Message from plan: Request Reference Number: [id number]. NUPLAZID CAP 34MG is approved through 06/08/2022. Your patient may now fill this prescription and it will be covered.

## 2021-06-08 NOTE — Telephone Encounter (Signed)
New message   Your information has been sent to [facility].  [facility] (Key: [id number]) Rx #: [id number] Nuplazid 34MG capsules   Form [employer] Medicare Part D Electronic Prior Authorization Form (2017 NCPDP) Created 1 day ago Sent to Plan 8 minutes ago Plan Response 7 minutes ago Submit Clinical Questions less than a minute ago Determination Wait for Determination Please wait for [employer] Medicare 2017 NCPDP to return a determination.

## 2021-06-21 ENCOUNTER — Telehealth: Payer: Self-pay

## 2021-06-21 NOTE — Telephone Encounter (Signed)
New message   Your information has been sent to [facility] Key: [id number] - PA Case ID: [id number] Need help? Call us at [phone] Status Sent to [place] 5000UNIT/ML solution Form [employer] Medicare Part D Electronic Prior Authorization Form (2017 NCPDP)

## 2021-06-21 NOTE — Telephone Encounter (Signed)
F/u  [facility] Key: Carron Brazen - PA Case ID: [id number] Need help? Call us at [phone] Outcome Approvedtoday Request Reference Number: [id number]. MYOBLOC INJ 5000/ML is approved through 09/19/2021. Your patient may now fill this prescription and it will be covered. Drug Myobloc 5000UNIT/ML solution Form [employer] Medicare Part D Electronic Prior Authorization Form (2017 NCPD

## 2021-06-27 ENCOUNTER — Other Ambulatory Visit: Payer: Self-pay | Admitting: Neurology

## 2021-07-09 ENCOUNTER — Other Ambulatory Visit: Payer: Self-pay | Admitting: Neurology

## 2021-07-10 ENCOUNTER — Other Ambulatory Visit (HOSPITAL_COMMUNITY): Payer: Self-pay

## 2021-07-11 ENCOUNTER — Encounter: Payer: Self-pay | Admitting: Neurology

## 2021-07-12 ENCOUNTER — Telehealth: Payer: Self-pay | Admitting: Neurology

## 2021-07-12 ENCOUNTER — Other Ambulatory Visit (HOSPITAL_COMMUNITY): Payer: Self-pay

## 2021-07-12 NOTE — Telephone Encounter (Signed)
Patient dismissed from [facility] 07/11/21

## 2021-07-20 ENCOUNTER — Ambulatory Visit: Payer: Medicare HMO | Admitting: Neurology

## 2021-08-14 ENCOUNTER — Telehealth: Payer: Self-pay | Admitting: Neurology

## 2021-08-14 NOTE — Telephone Encounter (Signed)
Patient dismissed from [facility] 07/11/21 ?

## 2021-09-05 ENCOUNTER — Other Ambulatory Visit (HOSPITAL_COMMUNITY): Payer: Self-pay

## 2022-08-09 DEATH — deceased
